# Patient Record
Sex: Female | Born: 1960 | Race: White | Hispanic: No | Marital: Married | Smoking: Never smoker
Health system: Southern US, Community
[De-identification: ages and names within clinical notes are randomized; demographics above are authoritative.]

## PROBLEM LIST (undated history)

## (undated) DIAGNOSIS — S0300XA Dislocation of jaw, unspecified side, initial encounter: Secondary | ICD-10-CM

## (undated) DIAGNOSIS — D219 Benign neoplasm of connective and other soft tissue, unspecified: Secondary | ICD-10-CM

## (undated) DIAGNOSIS — F419 Anxiety disorder, unspecified: Secondary | ICD-10-CM

## (undated) DIAGNOSIS — C801 Malignant (primary) neoplasm, unspecified: Secondary | ICD-10-CM

## (undated) DIAGNOSIS — R87619 Unspecified abnormal cytological findings in specimens from cervix uteri: Secondary | ICD-10-CM

## (undated) DIAGNOSIS — M8580 Other specified disorders of bone density and structure, unspecified site: Secondary | ICD-10-CM

## (undated) DIAGNOSIS — C439 Malignant melanoma of skin, unspecified: Secondary | ICD-10-CM

## (undated) DIAGNOSIS — B009 Herpesviral infection, unspecified: Secondary | ICD-10-CM

## (undated) DIAGNOSIS — I10 Essential (primary) hypertension: Secondary | ICD-10-CM

## (undated) HISTORY — DX: Unspecified abnormal cytological findings in specimens from cervix uteri: R87.619

## (undated) HISTORY — DX: Dislocation of jaw, unspecified side, initial encounter: S03.00XA

## (undated) HISTORY — DX: Other specified disorders of bone density and structure, unspecified site: M85.80

## (undated) HISTORY — DX: Essential (primary) hypertension: I10

## (undated) HISTORY — DX: Malignant (primary) neoplasm, unspecified: C80.1

## (undated) HISTORY — DX: Herpesviral infection, unspecified: B00.9

## (undated) HISTORY — DX: Malignant melanoma of skin, unspecified: C43.9

## (undated) HISTORY — DX: Benign neoplasm of connective and other soft tissue, unspecified: D21.9

## (undated) HISTORY — DX: Anxiety disorder, unspecified: F41.9

## (undated) HISTORY — PX: MELANOMA EXCISION: SHX5266

---

## 1988-12-12 DIAGNOSIS — R87619 Unspecified abnormal cytological findings in specimens from cervix uteri: Secondary | ICD-10-CM

## 1988-12-12 HISTORY — DX: Unspecified abnormal cytological findings in specimens from cervix uteri: R87.619

## 2005-07-21 ENCOUNTER — Other Ambulatory Visit: Admission: RE | Admit: 2005-07-21 | Discharge: 2005-07-21 | Payer: Self-pay | Admitting: Obstetrics and Gynecology

## 2005-07-27 ENCOUNTER — Encounter: Admission: RE | Admit: 2005-07-27 | Discharge: 2005-07-27 | Payer: Self-pay | Admitting: Obstetrics and Gynecology

## 2006-08-08 ENCOUNTER — Encounter: Admission: RE | Admit: 2006-08-08 | Discharge: 2006-08-08 | Payer: Self-pay | Admitting: Obstetrics and Gynecology

## 2007-11-29 ENCOUNTER — Ambulatory Visit (HOSPITAL_COMMUNITY): Admission: RE | Admit: 2007-11-29 | Discharge: 2007-11-29 | Payer: Self-pay | Admitting: Obstetrics and Gynecology

## 2008-11-10 ENCOUNTER — Ambulatory Visit: Payer: Self-pay | Admitting: Psychiatry

## 2011-01-02 ENCOUNTER — Encounter: Payer: Self-pay | Admitting: Obstetrics and Gynecology

## 2011-02-07 DIAGNOSIS — D239 Other benign neoplasm of skin, unspecified: Secondary | ICD-10-CM | POA: Insufficient documentation

## 2011-02-28 ENCOUNTER — Other Ambulatory Visit: Payer: Self-pay | Admitting: Obstetrics and Gynecology

## 2011-02-28 DIAGNOSIS — N644 Mastodynia: Secondary | ICD-10-CM

## 2011-03-03 ENCOUNTER — Ambulatory Visit
Admission: RE | Admit: 2011-03-03 | Discharge: 2011-03-03 | Disposition: A | Discharge status: Home / Self Care | Payer: Self-pay | Source: Ambulatory Visit | Attending: Obstetrics and Gynecology | Admitting: Obstetrics and Gynecology

## 2011-03-03 DIAGNOSIS — N644 Mastodynia: Secondary | ICD-10-CM

## 2011-03-08 ENCOUNTER — Ambulatory Visit: Payer: Private Health Insurance - Indemnity | Admitting: Hematology & Oncology

## 2011-03-16 ENCOUNTER — Ambulatory Visit (HOSPITAL_BASED_OUTPATIENT_CLINIC_OR_DEPARTMENT_OTHER): Payer: Private Health Insurance - Indemnity | Admitting: Hematology & Oncology

## 2011-03-16 DIAGNOSIS — F341 Dysthymic disorder: Secondary | ICD-10-CM

## 2011-03-16 DIAGNOSIS — C4360 Malignant melanoma of unspecified upper limb, including shoulder: Secondary | ICD-10-CM

## 2011-03-28 DIAGNOSIS — C439 Malignant melanoma of skin, unspecified: Secondary | ICD-10-CM | POA: Insufficient documentation

## 2011-06-07 DIAGNOSIS — E559 Vitamin D deficiency, unspecified: Secondary | ICD-10-CM | POA: Insufficient documentation

## 2011-10-20 DIAGNOSIS — J309 Allergic rhinitis, unspecified: Secondary | ICD-10-CM | POA: Insufficient documentation

## 2013-07-11 DIAGNOSIS — L659 Nonscarring hair loss, unspecified: Secondary | ICD-10-CM | POA: Insufficient documentation

## 2013-11-26 DIAGNOSIS — L908 Other atrophic disorders of skin: Secondary | ICD-10-CM

## 2013-11-26 DIAGNOSIS — L988 Other specified disorders of the skin and subcutaneous tissue: Secondary | ICD-10-CM | POA: Insufficient documentation

## 2014-01-08 DIAGNOSIS — G4700 Insomnia, unspecified: Secondary | ICD-10-CM | POA: Insufficient documentation

## 2014-01-08 DIAGNOSIS — F419 Anxiety disorder, unspecified: Secondary | ICD-10-CM | POA: Insufficient documentation

## 2016-06-27 DIAGNOSIS — D239 Other benign neoplasm of skin, unspecified: Secondary | ICD-10-CM | POA: Insufficient documentation

## 2016-12-26 ENCOUNTER — Other Ambulatory Visit: Payer: Self-pay | Admitting: Obstetrics and Gynecology

## 2016-12-26 DIAGNOSIS — R928 Other abnormal and inconclusive findings on diagnostic imaging of breast: Secondary | ICD-10-CM

## 2017-01-09 ENCOUNTER — Ambulatory Visit
Admission: RE | Admit: 2017-01-09 | Discharge: 2017-01-09 | Disposition: A | Discharge status: Home / Self Care | Payer: Commercial Managed Care - PPO | Source: Ambulatory Visit | Attending: Obstetrics and Gynecology | Admitting: Obstetrics and Gynecology

## 2017-01-09 ENCOUNTER — Other Ambulatory Visit: Payer: Self-pay | Admitting: Obstetrics and Gynecology

## 2017-01-09 DIAGNOSIS — Z170 Estrogen receptor positive status [ER+]: Secondary | ICD-10-CM | POA: Insufficient documentation

## 2017-01-09 DIAGNOSIS — R928 Other abnormal and inconclusive findings on diagnostic imaging of breast: Secondary | ICD-10-CM

## 2017-01-09 DIAGNOSIS — C50812 Malignant neoplasm of overlapping sites of left female breast: Secondary | ICD-10-CM | POA: Insufficient documentation

## 2017-01-09 DIAGNOSIS — N6489 Other specified disorders of breast: Secondary | ICD-10-CM

## 2017-01-11 ENCOUNTER — Other Ambulatory Visit: Payer: Commercial Managed Care - PPO

## 2017-03-01 DIAGNOSIS — Z884 Allergy status to anesthetic agent status: Secondary | ICD-10-CM | POA: Insufficient documentation

## 2017-03-02 DIAGNOSIS — C801 Malignant (primary) neoplasm, unspecified: Secondary | ICD-10-CM

## 2017-03-02 HISTORY — PX: BREAST SURGERY: SHX581

## 2017-03-02 HISTORY — DX: Malignant (primary) neoplasm, unspecified: C80.1

## 2017-03-27 ENCOUNTER — Encounter: Payer: Self-pay | Admitting: Obstetrics and Gynecology

## 2017-03-27 ENCOUNTER — Ambulatory Visit (INDEPENDENT_AMBULATORY_CARE_PROVIDER_SITE_OTHER): Payer: Commercial Managed Care - PPO | Admitting: Obstetrics and Gynecology

## 2017-03-27 VITALS — BP 130/76 | HR 84 | Resp 18 | Ht 61.5 in | Wt 110.2 lb

## 2017-03-27 DIAGNOSIS — N760 Acute vaginitis: Secondary | ICD-10-CM | POA: Diagnosis not present

## 2017-03-27 DIAGNOSIS — Z113 Encounter for screening for infections with a predominantly sexual mode of transmission: Secondary | ICD-10-CM | POA: Diagnosis not present

## 2017-03-27 DIAGNOSIS — Z8742 Personal history of other diseases of the female genital tract: Secondary | ICD-10-CM

## 2017-03-27 DIAGNOSIS — N952 Postmenopausal atrophic vaginitis: Secondary | ICD-10-CM | POA: Diagnosis not present

## 2017-03-27 NOTE — Patient Instructions (Signed)
Vaginitis Vaginitis is an inflammation of the vagina. It is most often caused by a change in the normal balance of the bacteria and yeast that live in the vagina. This change in balance causes an overgrowth of certain bacteria or yeast, which causes the inflammation. There are different types of vaginitis, but the most common types are:  Bacterial vaginosis.  Yeast infection (candidiasis).  Trichomoniasis vaginitis. This is a sexually transmitted infection (STI).  Viral vaginitis.  Atrophic vaginitis.  Allergic vaginitis. What are the causes? The cause depends on the type of vaginitis. Vaginitis can be caused by:  Bacteria (bacterial vaginosis).  Yeast (yeast infection).  A parasite (trichomoniasis vaginitis)  A virus (viral vaginitis).  Low hormone levels (atrophic vaginitis). Low hormone levels can occur during pregnancy, breastfeeding, or after menopause.  Irritants, such as bubble baths, scented tampons, and feminine sprays (allergic vaginitis). Other factors can change the normal balance of the yeast and bacteria that live in the vagina. These include:  Antibiotic medicines.  Poor hygiene.  Diaphragms, vaginal sponges, spermicides, birth control pills, and intrauterine devices (IUD).  Sexual intercourse.  Infection.  Uncontrolled diabetes.  A weakened immune system. What are the signs or symptoms? Symptoms can vary depending on the cause of the vaginitis. Common symptoms include:  Abnormal vaginal discharge.  The discharge is white, gray, or yellow with bacterial vaginosis.  The discharge is thick, white, and cheesy with a yeast infection.  The discharge is frothy and yellow or greenish with trichomoniasis.  A bad vaginal odor.  The odor is fishy with bacterial vaginosis.  Vaginal itching, pain, or swelling.  Painful intercourse.  Pain or burning when urinating. Sometimes there are no symptoms. How is this treated? Treatment will vary depending on  the type of infection.  Bacterial vaginosis and trichomoniasis are often treated with antibiotic creams or pills.  Yeast infections are often treated with antifungal medicines, such as vaginal creams or suppositories.  Viral vaginitis has no cure, but symptoms can be treated with medicines that relieve discomfort. Your sexual partner should be treated as well.  Atrophic vaginitis may be treated with an estrogen cream, pill, suppository, or vaginal ring. If vaginal dryness occurs, lubricants and moisturizing creams may help. You may be told to avoid scented soaps, sprays, or douches.  Allergic vaginitis treatment involves quitting the use of the product that is causing the problem. Vaginal creams can be used to treat the symptoms. Follow these instructions at home:  Take all medicines as directed by your caregiver.  Keep your genital area clean and dry. Avoid soap and only rinse the area with water.  Avoid douching. It can remove the healthy bacteria in the vagina.  Do not use tampons or have sexual intercourse until your vaginitis has been treated. Use sanitary pads while you have vaginitis.  Wipe from front to back. This avoids the spread of bacteria from the rectum to the vagina.  Let air reach your genital area. ? Wear cotton underwear to decrease moisture buildup.  Avoid wearing underwear while you sleep until your vaginitis is gone.  Avoid tight pants and underwear or nylons without a cotton panel.  Take off wet clothing (especially bathing suits) as soon as possible.  Use mild, non-scented products. Avoid using irritants, such as:  Scented feminine sprays.  Fabric softeners.  Scented detergents.  Scented tampons.  Scented soaps or bubble baths.  Practice safe sex and use condoms. Condoms may prevent the spread of trichomoniasis and viral vaginitis. Contact a health care  provider if:  You have abdominal pain.  You have symptoms that last for more than 2-3  days.  You have a fever and your symptoms suddenly get worse. This information is not intended to replace advice given to you by your health care provider. Make sure you discuss any questions you have with your health care provider. Document Released: 09/25/2007 Document Revised: 10/19/2016 Document Reviewed: 10/19/2016 Elsevier Interactive Patient Education  2017 [employer].

## 2017-03-27 NOTE — Progress Notes (Signed)
GYNECOLOGY  VISIT   HPI: 56 y.o.   Married  Caucasian  female   G0P0000 with Patient's last menstrual period was 12/12/2014 (approximate).   here for vaginal discharge without odor or itching. Sometimes it is very "sticky". She has seen a previous GYN and been treated for bacterial vaginosis with Flagyl on 02/21/17.  Discharge that varies in color.  Clear, white, yellow, and green. Symptoms improved with treatment above but not completely resolved. No itching or odor.   Contact with baby oil in external genital area.   Infidellity issues on part of husband.  Had full STD testing following this.   No recent abx other than Flagyl.  Recent diagnosis of left breast cancer.  Status post lumpectomy and Sentinal node at [facility]. Negative node.  Working on the follow up plan, and has an appointment tomorrow.  Has questions about ovarian cancer.   Hx HRT off and on since 2013.  Stopped this 01/04/17. Hx spotting.  First TV US 11/2015 and had thin lining.  Vaginal ultrasound February 21, 2017 - fibroids per patient. No EMB done.   Saw a Dietitian at [facility] and genetic testing was not recommended as she is not a high risk candidate.  GYNECOLOGIC HISTORY: Patient's last menstrual period was 12/12/2014 (approximate). Contraception:  Abstinence Menopausal hormone therapy:  n/a Last mammogram: see Epic--recently dx'd with Lt.Br.cancer and had Lt.lumpectomy at [facility]. Last pap smear:   11/2016 normal per patient        OB History    Gravida Para Term Preterm AB Living   0 0 0 0 0 0   SAB TAB Ectopic Multiple Live Births   0 0 0 0 0         There are no active problems to display for this patient.   Past Medical History:  Diagnosis Date  . Abnormal Pap smear of cervix 1990   Posible cryotherapy.  . Cancer ([place]) 03/02/2017   left breast cancer  . Fibroid   . 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 BREAST SURGERY  03/02/2017    left lumpectomy--[facility]  . MELANOMA EXCISION     left tricep    Current Outpatient Prescriptions  Medication Sig Dispense Refill  . nabumetone (RELAFEN) 500 MG tablet Take 1 tablet by mouth 2 (two) times daily.     No current facility-administered medications for this visit.      ALLERGIES: Lidocaine; Prilocaine; and Tetracaine  Family History  Problem Relation Age of Onset  . Breast cancer Mother     DCIS  . Hypertension Mother   . Diabetes Mother     borderline  . Autoimmune disease Mother     giant cell arteritis  . Thyroid disease Mother     hypothyrodism  . Hypertension Father   . Hypertension Sister   . Thyroid disease Sister     hypothyroidism  . Hypertension Brother     Social History   Social History  . Marital status: Married    Spouse name: N/A  . Number of children: N/A  . Years of education: N/A   Occupational History  . Not on file.   Social History Main Topics  . Smoking status: Never Smoker  . Smokeless tobacco: Never Used  . Alcohol use No  . Drug use: No  . Sexual activity: No   Other Topics Concern  . Not on file   Social History Narrative  .  No narrative on file    ROS:  Pertinent items are noted in HPI.  PHYSICAL EXAMINATION:    BP 130/76 (BP Location: Left Arm, Patient Position: Sitting, Cuff Size: Normal)   Pulse 84   Resp 18   Ht 5' 1.5" (1.562 m)   Wt 110 lb 3.2 oz (50 kg)   LMP 12/12/2014 (Approximate)   BMI 20.48 kg/m     General appearance: alert, cooperative and appears stated age Head: Normocephalic, without obvious abnormality, atraumatic Neck: no adenopathy, supple, symmetrical, trachea midline and thyroid normal to inspection and palpation Lungs: clear to auscultation bilaterally Heart: regular rate and rhythm Abdomen: soft, non-tender, no masses,  no organomegaly Extremities: extremities normal, atraumatic, no cyanosis or edema Skin: Skin color, texture, turgor normal. No rashes or lesions No abnormal  inguinal nodes palpated Neurologic: Grossly normal  Pelvic: External genitalia:  Hymen with areas of erythema.               Urethra:  normal appearing urethra with no masses, tenderness or lesions              Bartholins and Skenes: normal                 Vagina: normal appearing vagina with normal color and discharge, no lesions.  Tender speculum exam.  Erythema noted in vagina consistent with atrophy.              Cervix: no lesions                Bimanual Exam:  Uterus:  normal size, contour, position, consistency, mobility, non-tender              Adnexa: no mass, fullness, tenderness          Chaperone was present for exam.  ASSESSMENT  Vaginitis.  Evidence of vaginal atrophy today.  Recent postmenopausal bleeding with negative [place] per patient.  New dx left breast cancer.  Off HRT.  Mother with DCIS.   PLAN  Discussion of vaginitis, postmenopausal bleeding, and HRT. Will do Affirm and full STD testing today.  Ovarian cancer discussed.  Patient is not an ideal candidate for genetic testing.  Will get prior records to review her evaluation for postmenopausal bleeding.  Follow up for annual exam in 8 months.  An After Visit Summary was printed and given to the patient.  __30____ minutes face to face time of which over 50% was spent in counseling.

## 2017-03-28 LAB — WET PREP BY MOLECULAR PROBE
CANDIDA SPECIES: NOT DETECTED
Gardnerella vaginalis: DETECTED — AB
Trichomonas vaginosis: NOT DETECTED

## 2017-03-28 LAB — HEPATITIS C ANTIBODY: HCV AB: NEGATIVE

## 2017-03-28 LAB — GC/CHLAMYDIA PROBE AMP
CT Probe RNA: NOT DETECTED
GC Probe RNA: NOT DETECTED

## 2017-03-28 LAB — STD PANEL
HEP B S AG: NEGATIVE
HIV 1&2 Ab, 4th Generation: NONREACTIVE

## 2017-03-29 ENCOUNTER — Encounter: Payer: Self-pay | Admitting: Obstetrics and Gynecology

## 2017-03-30 ENCOUNTER — Other Ambulatory Visit: Payer: Self-pay | Admitting: *Deleted

## 2017-03-30 MED ORDER — METRONIDAZOLE 0.75 % VA GEL: 1.0000 | Freq: Every day | VAGINAL | 0 refills | Status: DC

## 2017-03-30 NOTE — Telephone Encounter (Signed)
See result note dated 03/30/17.

## 2017-04-06 ENCOUNTER — Telehealth: Payer: Self-pay | Admitting: Obstetrics and Gynecology

## 2017-04-06 ENCOUNTER — Encounter: Payer: Self-pay | Admitting: Obstetrics and Gynecology

## 2017-04-06 ENCOUNTER — Ambulatory Visit (INDEPENDENT_AMBULATORY_CARE_PROVIDER_SITE_OTHER): Payer: Commercial Managed Care - PPO | Admitting: Obstetrics and Gynecology

## 2017-04-06 VITALS — BP 142/88 | HR 100 | Temp 98.2°F | Ht 61.5 in | Wt 110.0 lb

## 2017-04-06 DIAGNOSIS — N760 Acute vaginitis: Secondary | ICD-10-CM | POA: Diagnosis not present

## 2017-04-06 MED ORDER — FLUCONAZOLE 150 MG PO TABS
150.0000 mg | ORAL_TABLET | Freq: Once | ORAL | 0 refills | Status: AC
Start: 2017-04-06 — End: 2017-04-06

## 2017-04-06 NOTE — Progress Notes (Signed)
GYNECOLOGY  VISIT   HPI: 56 y.o.   Married  Caucasian  female   G0P0000 with Patient's last menstrual period was 12/12/2014 (approximate).   here for white vaginal discharge and what appears to be flesh "rotting" away inside vagina. Patient is concerned about MRSA.  Notices something white and black. Use the Metrogel with the applicator for 5 nights. Denies pain.   No itching, burning, or having odor.   Has oral HSV.   Worried about MRSA.  Not sexually active currently.   Uses [employer] soap for her showers and it always burns.  Temp 98.2  GYNECOLOGIC HISTORY: Patient's last menstrual period was 12/12/2014 (approximate). Contraception: Abstinence  Menopausal hormone therapy:  n/a Last mammogram:  see Epic--recently dx'd with Lt.Br.cancer and had Lt.lumpectomy at [facility]. Last pap smear: 11/2016 normal per patient        OB History    Gravida Para Term Preterm AB Living   0 0 0 0 0 0   SAB TAB Ectopic Multiple Live Births   0 0 0 0 0         There are no active problems to display for this patient.   Past Medical History:  Diagnosis Date  . Abnormal Pap smear of cervix 1990   Posible cryotherapy.  . Cancer ([place]) 03/02/2017   left breast cancer  . Fibroid   . HSV-1 infection   . 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 BREAST SURGERY  03/02/2017   left lumpectomy--[facility]  . MELANOMA EXCISION     left tricep    Current Outpatient Prescriptions  Medication Sig Dispense Refill  . nabumetone (RELAFEN) 500 MG tablet Take 1 tablet by mouth 2 (two) times daily.    . metroNIDAZOLE (METROGEL) 0.75 % vaginal gel Place 1 Applicatorful vaginally at bedtime. For 5 nights. (Patient not taking: Reported on 04/06/2017) 70 g 0   No current facility-administered medications for this visit.      ALLERGIES: Prilocaine; Procaine; and Tetracaine  Family History  Problem Relation Age of Onset  . Breast cancer Mother      DCIS  . Hypertension Mother   . Diabetes Mother     borderline  . Autoimmune disease Mother     giant cell arteritis  . Thyroid disease Mother     hypothyrodism  . Hypertension Father   . Hypertension Sister   . Thyroid disease Sister     hypothyroidism  . Hypertension Brother     Social History   Social History  . Marital status: Married    Spouse name: N/A  . Number of children: N/A  . Years of education: N/A   Occupational History  . Not on file.   Social History Main Topics  . Smoking status: Never Smoker  . Smokeless tobacco: Never Used  . Alcohol use No  . Drug use: No  . Sexual activity: No   Other Topics Concern  . Not on file   Social History Narrative  . No narrative on file    ROS:  Pertinent items are noted in HPI.  PHYSICAL EXAMINATION:    BP (!) 142/88 (BP Location: Left Arm, Patient Position: Sitting, Cuff Size: Normal)   Pulse 100   Temp 98.2 F (36.8 C) (Oral)   Ht 5' 1.5" (1.562 m)   Wt 110 lb (49.9 kg)   LMP 12/12/2014 (Approximate)   BMI 20.45 kg/m  General appearance: alert, cooperative and appears stated age    Pelvic: External genitalia:  Atrophy of the bilateral labia minora.  Very mild urethral prolapase noted.              Urethra:  normal appearing urethra with no masses, tenderness or lesions              Bartholins and Skenes: normal                 Vagina: normal appearing vagina with normal color and discharge, no lesions.  Clumpy clear and white discharge.              Cervix: no lesions                Bimanual Exam:  Uterus:  normal size, contour, position, consistency, mobility, non-tender              Adnexa: no mass, fullness, tenderness                 Chaperone was present for exam.  ASSESSMENT  Vulvovaginitis.  No evidence of MRSA. I think that this is multifactorial including atrophy, dermatitis from product irritants, and Candida.  Hx breast cancer.   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  We talked about forms of vaginitis today.  No Affirm done due to the Metrogel present. Will treat with Diflucan today.  Use Dove soap.  Change laundry detergent to hypoallergenic.  For atrophy can use water based products, cooking oil, vit E suppositories, or Josph Macho.  I would try the more simple products prior to doing Josph Macho.  Patient appreciative of her care today.   An After Visit Summary was printed and given to the patient.  __25____ minutes face to face time of which over 50% was spent in counseling.

## 2017-04-06 NOTE — Telephone Encounter (Signed)
Patient saw Dr Quincy Simmonds last week for a discharge.  States she woke up this morning and looked at her vagina with a mirror and it looks like the skin is "rotting off".  Patient has no pain, itching, burning, or bleeding just "looks like her flesh is disappearing".

## 2017-04-06 NOTE — Telephone Encounter (Signed)
Spoke with patient. Patient states she was treated with metrogel for BV and was aware could possibly develop yeast infection from metrogel. Patient states she was looking at vagina with mirror yesterday and noticed a little white discharge. Patient states she looked again this morning and noticed white discharge and what appears to be flesh on inside of vagina rotting away. Patient denies any other symptoms, states she was seen at [facility] on Tues and was advised temp was 1 degree above normal, denies any other symptoms. Recommended OV for further evaluation with Dr. Quincy Simmonds, patient scheduled for today at 10am. Patient is agreeable.  Routing to provider for final review. Patient is agreeable to disposition. Will close encounter.

## 2017-04-07 ENCOUNTER — Encounter: Payer: Self-pay | Admitting: Obstetrics and Gynecology

## 2017-04-12 ENCOUNTER — Encounter: Payer: Self-pay | Admitting: Obstetrics and Gynecology

## 2017-04-19 ENCOUNTER — Encounter: Payer: Self-pay | Admitting: Obstetrics and Gynecology

## 2017-04-27 ENCOUNTER — Encounter: Payer: Self-pay | Admitting: Obstetrics and Gynecology

## 2017-07-20 DIAGNOSIS — E7800 Pure hypercholesterolemia, unspecified: Secondary | ICD-10-CM | POA: Insufficient documentation

## 2017-09-08 ENCOUNTER — Telehealth: Payer: Self-pay | Admitting: Obstetrics and Gynecology

## 2017-09-08 NOTE — Telephone Encounter (Signed)
Patient having vaginal pain and incontinence.

## 2017-09-08 NOTE — Telephone Encounter (Signed)
Spoke with patient, request appointment with Dr. Quincy Simmonds for  "weird vaginal pain" and incontinence that has been ongoing for the last 3-4 months.  Reports pain in vagina when showering and wiping, describes as "stinging". Feels like this is r/t "structural changes". Denies pain with urination.  Reports urinary incontinence, "can't hold it" and has to now get up during the night to void.   Denies fever, lower back pain, N/V, frequency, discharge, odor, bleeding.   Has been taking Letrozole since August, symptoms started before starting medication.   Patient states she has multiple appointments in the next 2 weeks, request to schedule 10/15. Scheduled for OV on 10/15 at 1pm with Dr. Quincy Simmonds. Advised patient should symptoms worsen or new symptoms develop, return call to office for earlier appointment. Will review with Dr. Quincy Simmonds and return call with any additional recommendations.   Routing to provider for final review. Patient is agreeable to disposition. Will close encounter.

## 2017-09-25 ENCOUNTER — Encounter: Payer: Self-pay | Admitting: Obstetrics and Gynecology

## 2017-09-25 ENCOUNTER — Ambulatory Visit (INDEPENDENT_AMBULATORY_CARE_PROVIDER_SITE_OTHER): Payer: Commercial Managed Care - PPO | Admitting: Obstetrics and Gynecology

## 2017-09-25 VITALS — BP 118/70 | HR 84 | Resp 16 | Wt 107.0 lb

## 2017-09-25 DIAGNOSIS — R32 Unspecified urinary incontinence: Secondary | ICD-10-CM | POA: Diagnosis not present

## 2017-09-25 DIAGNOSIS — N905 Atrophy of vulva: Secondary | ICD-10-CM

## 2017-09-25 LAB — POCT URINALYSIS DIPSTICK
BILIRUBIN UA: NEGATIVE
Blood, UA: NEGATIVE
GLUCOSE UA: NEGATIVE
KETONES UA: NEGATIVE
Leukocytes, UA: NEGATIVE
Nitrite, UA: NEGATIVE
Protein, UA: NEGATIVE
Urobilinogen, UA: 0.2 E.U./dL
pH, UA: 5 (ref 5.0–8.0)

## 2017-09-25 NOTE — Progress Notes (Signed)
GYNECOLOGY  VISIT   HPI: 56 y.o.   Married  Caucasian  female   G0P0000 with Patient's last menstrual period was 12/12/2014 (approximate).   here for  Urinary incontinence.  Struggling with atrophy. Using a Vagisil wash, which does not burn.  Using Replens for moisture.   Has stinging with bathing and sometimes wiping.  No spontaneous pain.  Aloe wipes help.   Leaks with sneezing.  No leak with laugh, cough, or exercise.  Leaks before she can get to the bathroom on time.  DF - in the am every 30 minutes after coffee, then every 3 hours in the afternoon.  NF - 1 - 2 times. Does not think her bladder has dropped but has some concern as she was told at another GYN office that she may need surgery some day.  Denies constipation.  Calcium causes constipation for her.  No fecal incontinence.   Just started tamoxifen this week.   New dx of osteopenia.   ROS otherwise negative.   Urine Dip - Negative   GYNECOLOGIC HISTORY: Patient's last menstrual period was 12/12/2014 (approximate). Contraception:  Abstinence/postmenopausal Menopausal hormone therapy:  none Last mammogram:  See EPIC for details -- scheduled 12/20/17 at [place] pap smear:   11/2016 normal per patient        OB History    Gravida Para Term Preterm AB Living   0 0 0 0 0 0   SAB TAB Ectopic Multiple Live Births   0 0 0 0 0         There are no active problems to display for this patient.   Past Medical History:  Diagnosis Date  . Abnormal Pap smear of cervix 1990   Posible cryotherapy.  . Cancer ([place]) 03/02/2017   left breast cancer  . Fibroids   . HSV-1 infection   . 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 BREAST SURGERY  03/02/2017   left lumpectomy--[facility]  . MELANOMA EXCISION     left tricep    Current Outpatient Prescriptions  Medication Sig Dispense Refill  . acyclovir (ZOVIRAX) 800 MG tablet as needed.    . Calcium Carbonate-Vitamin D  600-400 MG-UNIT tablet Take 1 tablet by mouth daily.  11  . diclofenac (CATAFLAM) 50 MG tablet as needed.    . Diclofenac Sodium (PENNSAID) 2 % SOLN as needed.    . etodolac (LODINE) 400 MG tablet as needed.    . montelukast (SINGULAIR) 10 MG tablet Take by mouth daily.    . rosuvastatin (CRESTOR) 10 MG tablet Take by mouth daily.    . tamoxifen (NOLVADEX) 20 MG tablet Take by mouth daily.    Marland Kitchen tiZANidine (ZANAFLEX) 4 MG tablet as needed.     No current facility-administered medications for this visit.      ALLERGIES: Benzocaine; Epinephrine; Paba derivatives; Lanolin; Nickel; Prilocaine; Procaine; and Tetracaine  Family History  Problem Relation Age of Onset  . Breast cancer Mother        DCIS  . Hypertension Mother   . Diabetes Mother        borderline  . Autoimmune disease Mother        giant cell arteritis  . Thyroid disease Mother        hypothyrodism  . Hypertension Father   . Hypertension Sister   . Thyroid disease Sister        hypothyroidism  . Hypertension Brother  Social History   Social History  . Marital status: Married    Spouse name: N/A  . Number of children: N/A  . Years of education: N/A   Occupational History  . Not on file.   Social History Main Topics  . Smoking status: Never Smoker  . Smokeless tobacco: Never Used  . Alcohol use No  . Drug use: No  . Sexual activity: No   Other Topics Concern  . Not on file   Social History Narrative  . No narrative on file    ROS:  Pertinent items are noted in HPI.  PHYSICAL EXAMINATION:    BP 118/70 (BP Location: Right Arm, Patient Position: Sitting, Cuff Size: Normal)   Pulse 84   Resp 16   Wt 107 lb (48.5 kg)   LMP 12/12/2014 (Approximate)   BMI 19.89 kg/m     General appearance: alert, cooperative and appears stated age    Pelvic: External genitalia:  no lesions              Urethra:  normal appearing urethra with no masses, tenderness or lesions              Bartholins and Skenes:  normal                 Vagina: normal appearing vagina with normal color and discharge, no lesions.  Atrophy noted.               Cervix: no lesions                Bimanual Exam:  Uterus:  normal size, contour, position, consistency, mobility, non-tender              Adnexa: no mass, fullness, tenderness        Chaperone was present for exam.  ASSESSMENT  Urinary incontinence and frequency.  Vaginal atrophy.  No evidence of prolapse. Hx breast cancer.  Started tamoxifen.  Osteopenia.   PLAN  Discussed urinary incontinence and frequency.  Kegel's.  Avoid bladder irritants.  I discussed anticholinergic/antimuscarinic medications.  Will not do this for now.  We reviewed vit E vaginal suppositories and cooking oils for vaginal hydration.  I recommended against the Josph Macho touch after sharing published information from [employer]. Return for annual exam and routing labs   An After Visit Summary was printed and given to the patient.  __15____ minutes face to face time of which over 50% was spent in counseling.

## 2017-09-25 NOTE — Patient Instructions (Addendum)
Go to [employer] to buy vaginal vitamin E suppositories.  Also try cooking oils like coconut oil.  Kegel Exercises Kegel exercises help strengthen the muscles that support the rectum, vagina, small intestine, bladder, and uterus. Doing Kegel exercises can help:  Improve bladder and bowel control.  Improve sexual response.  Reduce problems and discomfort during pregnancy.  Kegel exercises involve squeezing your pelvic floor muscles, which are the same muscles you squeeze when you try to stop the flow of urine. The exercises can be done while sitting, standing, or lying down, but it is best to vary your position. Phase 1 exercises 1. Squeeze your pelvic floor muscles tight. You should feel a tight lift in your rectal area. If you are a female, you should also feel a tightness in your vaginal area. Keep your stomach, buttocks, and legs relaxed. 2. Hold the muscles tight for up to 10 seconds. 3. Relax your muscles. Repeat this exercise 50 times a day or as many times as told by your health care provider. Continue to do this exercise for at least 4-6 weeks or for as long as told by your health care provider. This information is not intended to replace advice given to you by your health care provider. Make sure you discuss any questions you have with your health care provider. Document Released: 11/14/2012 Document Revised: 07/23/2016 Document Reviewed: 10/18/2015 Elsevier Interactive Patient Education  [employer].

## 2017-11-07 ENCOUNTER — Telehealth: Payer: Self-pay | Admitting: Obstetrics and Gynecology

## 2017-11-07 NOTE — Telephone Encounter (Signed)
Spoke with patient. Patient states that she has had a vaginal discharge since 09/26/2017 that is not getting better. Patient has an annual exam on 12/07/2017 and is asking if she can have testing done at that appointment or move her aex up. Aex moved to 11/17/2017 at 1:30 pm with Dr.Silva. Patient is agreeable to date and time.  Routing to provider for final review. Patient agreeable to disposition. Will close encounter.

## 2017-11-07 NOTE — Telephone Encounter (Signed)
patint thinks she has a bacterial infection

## 2017-11-17 ENCOUNTER — Other Ambulatory Visit: Payer: Self-pay

## 2017-11-17 ENCOUNTER — Encounter: Payer: Self-pay | Admitting: Obstetrics and Gynecology

## 2017-11-17 ENCOUNTER — Other Ambulatory Visit (HOSPITAL_COMMUNITY)
Admission: RE | Admit: 2017-11-17 | Discharge: 2017-11-17 | Disposition: A | Discharge status: Home / Self Care | Payer: Commercial Managed Care - PPO | Source: Ambulatory Visit | Attending: Obstetrics and Gynecology | Admitting: Obstetrics and Gynecology

## 2017-11-17 ENCOUNTER — Ambulatory Visit (INDEPENDENT_AMBULATORY_CARE_PROVIDER_SITE_OTHER): Payer: Commercial Managed Care - PPO | Admitting: Obstetrics and Gynecology

## 2017-11-17 VITALS — BP 142/76 | HR 74 | Resp 14 | Ht 61.25 in | Wt 109.5 lb

## 2017-11-17 DIAGNOSIS — N760 Acute vaginitis: Secondary | ICD-10-CM | POA: Diagnosis not present

## 2017-11-17 DIAGNOSIS — Z01419 Encounter for gynecological examination (general) (routine) without abnormal findings: Secondary | ICD-10-CM

## 2017-11-17 NOTE — Progress Notes (Signed)
56 y.o. G15P0000 Married Caucasian female here for annual exam.    Wants yearly pap.  Reports some white vaginal discharge.  Some slight odor.   On Tamoxifen.   New dx osteopenia.  [facility].   Asking about prior reports of pap and pelvic US from Dr. Malachi Carl office.  PCP:   none  Patient's last menstrual period was 12/12/2014 (approximate).           Sexually active: No.  The current method of family planning is post menopausal status.    Exercising: Yes.    weights, intervals  Smoker:  no  Health Maintenance: Pap:  11/12/14 negative, 2016 normal and negative HR HPV at [facility].   Patient confident she had a pap in 2017 with Dr. Philis Pique.  No documentation of this to date. History of abnormal Pap:  yes MMG: at [facility]- scheduled for 12/2017  Colonoscopy:  Summer 2018 with Dr. Collene Mares.  Adenoma.  Next colonoscopy in 3 years. BMD:   07/26/17  Result  Osteopenia of bilateral hips. TDaP:  Unsure  Gardasil:   n/a HIV: 03/27/17 negative  Hep C: 03/27/17 negative  Screening Labs: discuss with provider Hb today: same, Urine today: has sample    reports that  has never smoked. she has never used smokeless tobacco. She reports that she does not drink alcohol or use drugs.  Past Medical History:  Diagnosis Date  . Abnormal Pap smear of cervix 1990   Posible cryotherapy.  . Cancer ([place]) 03/02/2017   left breast cancer  . Fibroids   . HSV-1 infection   . 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 BREAST SURGERY  03/02/2017   left lumpectomy--[facility]  . MELANOMA EXCISION     left tricep    Current Outpatient Medications  Medication Sig Dispense Refill  . acyclovir (ZOVIRAX) 800 MG tablet as needed.    . Calcium Carbonate-Vitamin D 600-400 MG-UNIT tablet Take 1 tablet by mouth daily.  11  . diclofenac (VOLTAREN) 75 MG EC tablet Take by mouth.    . Diclofenac Sodium (PENNSAID) 2 % SOLN as needed.    . diphenhydrAMINE (BENADRYL) 25 mg capsule  Take by mouth.    . methocarbamol (ROBAXIN) 500 MG tablet     . rosuvastatin (CRESTOR) 10 MG tablet Take by mouth daily.    . tamoxifen (NOLVADEX) 20 MG tablet Take by mouth daily.    Marland Kitchen tiZANidine (ZANAFLEX) 4 MG tablet as needed.     No current facility-administered medications for this visit.     Family History  Problem Relation Age of Onset  . Breast cancer Mother        DCIS  . Hypertension Mother   . Diabetes Mother        borderline  . Autoimmune disease Mother        giant cell arteritis  . Thyroid disease Mother        hypothyrodism  . Hypertension Father   . Hypertension Sister   . Thyroid disease Sister        hypothyroidism  . Hypertension Brother     ROS:  Pertinent items are noted in HPI.  Otherwise, a comprehensive ROS was negative.  Exam:   BP (!) 142/76 (BP Location: Right Arm, Patient Position: Sitting, Cuff Size: Normal)   Pulse 74   Resp 14   Ht 5' 1.25" (1.556 m)   Wt 109 lb 8 oz (49.7 kg)  LMP 12/12/2014 (Approximate)   BMI 20.52 kg/m     General appearance: alert, cooperative and appears stated age Head: Normocephalic, without obvious abnormality, atraumatic Neck: no adenopathy, supple, symmetrical, trachea midline and thyroid normal to inspection and palpation Lungs: clear to auscultation bilaterally Breasts: bilateral generalized tenderness and scars, Normal appearance, no masses or tenderness, No nipple retraction or dimpling, No nipple discharge or bleeding, No axillary or supraclavicular adenopathy Heart: regular rate and rhythm Abdomen: soft, non-tender; no masses, no organomegaly Extremities: extremities normal, atraumatic, no cyanosis or edema Skin: Skin color, texture, turgor normal. No rashes or lesions Lymph nodes: Cervical, supraclavicular, and axillary nodes normal. No abnormal inguinal nodes palpated Neurologic: Grossly normal  Pelvic: External genitalia:  no lesions              Urethra:  normal appearing urethra with no  masses, tenderness or lesions              Bartholins and Skenes: normal                 Vagina: normal appearing vagina with normal color and discharge, no lesions              Cervix: no lesions              Pap taken: Yes.   Bimanual Exam:  Uterus:  normal size, contour, position, consistency, mobility, non-tender              Adnexa: no mass, fullness, tenderness              Rectal exam: Yes.  .  Confirms.              Anus:  normal sphincter tone, no lesions  Chaperone was present for exam.  Assessment:   Well woman visit with normal exam. Osteopenia.  Followed at [facility].  Left breast cancer.  Status post lumpectomy and XRT. On Tamoxifen.  Vaginitis.  Hx HSV I.  Hx melanoma.  Plan: Mammogram screening discussed. Recommended self breast awareness. Pap and HR HPV as above.  Vaginitis panel added to pap. Guidelines for Calcium, Vitamin D, regular exercise program including cardiovascular and weight bearing exercise. Will get a copy of pap from 2017, pelvic ultrasound report from 2018, and colonoscopy with Dr. Collene Mares. We talked about risk of adenocarcinoma of the endometrium while on Tamoxifen.  She will call for any bleeding, spotting, pelvic pain, or any concern. Sees dermatology regularly.  Follow up annually and prn.    After visit summary provided.

## 2017-11-17 NOTE — Patient Instructions (Signed)

## 2017-11-22 ENCOUNTER — Telehealth: Payer: Self-pay | Admitting: *Deleted

## 2017-11-22 ENCOUNTER — Telehealth: Payer: Self-pay

## 2017-11-22 ENCOUNTER — Encounter: Payer: Self-pay | Admitting: Obstetrics and Gynecology

## 2017-11-22 NOTE — Telephone Encounter (Signed)
Opened in error, will close encounter

## 2017-11-22 NOTE — Telephone Encounter (Signed)
MyChart Message  Visit Follow-Up Question  Message [id number]  From Alasha, Mcguinness To Nunzio Cobbs, MD Sent 11/22/2017 9:33 AM Hi!   I'm just wondering about the results of the BV test?   Thx!   Jaliana~   Responsible Party   Pool - [facility] Clinical Pool No one has taken responsibility for this message. No actions have been taken on this message.   Called patient to discuss, left message on voicemail to call me(results are pending).

## 2017-11-23 LAB — CYTOLOGY - PAP
Adequacy: ABSENT
Bacterial vaginitis: POSITIVE — AB
CANDIDA VAGINITIS: NEGATIVE
Diagnosis: NEGATIVE
HPV (WINDOPATH): NOT DETECTED
TRICH (WINDOWPATH): NEGATIVE

## 2017-11-23 NOTE — Telephone Encounter (Signed)
Called patient to discuss lab results, lmovm to call me back. (results not in Epic yet, so called [facility] cytology and they faxed me a copy of pap and culture results).

## 2017-11-24 MED ORDER — METRONIDAZOLE 500 MG PO TABS: 500.0000 mg | ORAL_TABLET | Freq: Two times a day (BID) | ORAL | 0 refills | Status: DC

## 2017-11-24 NOTE — Telephone Encounter (Signed)
Patient returning [name]'s call.

## 2017-11-24 NOTE — Telephone Encounter (Signed)
Spoke with patient and notified of positive bacterial vaginitis per Dr.Miller. Advised will escribed Metrogel or Metronidazole--her preference. Patient prefers oral medication. Sent Rx for G.Flagyl 500mg  #14, 1 po bid #14, NR to [facility] on [address]. Advised patient to call if symptoms don't resolve. Also informed her that her pap smear was negative and HR HPV was negative. She has AEX 11-19-18.

## 2017-12-01 ENCOUNTER — Telehealth: Payer: Self-pay

## 2017-12-01 NOTE — Telephone Encounter (Signed)
Opened in error

## 2017-12-07 ENCOUNTER — Ambulatory Visit: Payer: Commercial Managed Care - PPO | Admitting: Obstetrics and Gynecology

## 2017-12-08 ENCOUNTER — Telehealth: Payer: Self-pay

## 2017-12-08 MED ORDER — METRONIDAZOLE 0.75 % VA GEL: 1.0000 | Freq: Every day | VAGINAL | 0 refills | Status: DC

## 2017-12-08 NOTE — Telephone Encounter (Signed)
-----   Message from Megan Salon, MD sent at 11/24/2017  5:45 PM EST ----- Regarding: outside record review Tonya Yang, Could you call this pt?  Her outside records came (a stack about 2 inches thick).  There is no pap result from 2017 and the notes do not indicate a pap smear was done.  There was a vaginal swab done from 2017 for yeast and BV that was negative.    Also, her ultrasound showed thin endometrium, four fibroids with the largest measuring 4cm and normal ovaries.    I think Dr. Quincy Simmonds was getting this to review as pt had some questions.  I'm glad to try and answer them if I can.  Thanks.  Vinnie Level

## 2017-12-08 NOTE — Telephone Encounter (Signed)
Ok to treat with metrogel 0.75% nightly x 5 nights.  If still symptomatic, would recommend repeat testing for BV.

## 2017-12-08 NOTE — Telephone Encounter (Signed)
Spoke with patient and notified would send Rx for Metrogel to use per vagina nightly x 5 nights per Dr.Miller. If symptoms persist, she would need to make a return office visit. Patient voices understanding.

## 2017-12-08 NOTE — Telephone Encounter (Signed)
Spoke with patient and advised we had received outside records. Advised there was no pap results from 2017 and the notes do not indicate a pap smear was done. Also advised PUS revealed thin endometrium, four fibroids and normal ovaries. Offered follow up with Dr.Miller if she had any questions since Dr.Silva out on leave of absence. Patient states Dr.Silva just wanted records to review and patient doesn't have any questions.  Patient was treated 2 weeks ago for BV with oral Flagyl 500mg  bid x7days without complete resolve of symptoms. She states when she has had this before, she thinks she had to take the Metrogel. Still with some vaginal discharge and odor. Will discuss with Dr.Miller and call her back. She wanted to thank Dr.Miller for all her help. Routed to [place]

## 2017-12-15 ENCOUNTER — Ambulatory Visit: Payer: Commercial Managed Care - PPO | Admitting: Obstetrics and Gynecology

## 2018-02-05 ENCOUNTER — Telehealth: Payer: Self-pay | Admitting: Obstetrics and Gynecology

## 2018-02-05 ENCOUNTER — Encounter: Payer: Self-pay | Admitting: Obstetrics and Gynecology

## 2018-02-05 NOTE — Telephone Encounter (Signed)
-----   Message from [place], Generic sent at 02/05/2018 11:52 AM EST -----    Hi!    It seems the BV didn't completely resolve after the metrogel tx; although, I did miss one night's dose. I was hoping it may clear on its on, but that doesn't seem to be working.     I'm not in any discomfort so no rush needed to respond.     Thanks,    Hinton Dyer

## 2018-02-05 NOTE — Telephone Encounter (Signed)
Spoke with patient, reports continued vaginal d/c, requesting RX for BV.   Reports yellow, vaginal d/c, no odor has not resolved. Treated for BV with Flagyl po on 12/14 and metrogel on 12/28.   Denies any other GYN symptoms, pain or bleeding.   Recommended OV for further evaluation. OV scheduled for 02/12/18 at 3:30pm with Dr. Quincy Simmonds. Patient verbalizes understanding and is agreeable.   Routing to provider for final review. Patient is agreeable to disposition. Will close encounter.

## 2018-02-12 ENCOUNTER — Encounter: Payer: Self-pay | Admitting: Obstetrics and Gynecology

## 2018-02-12 ENCOUNTER — Ambulatory Visit: Payer: Commercial Managed Care - PPO | Admitting: Obstetrics and Gynecology

## 2018-02-12 ENCOUNTER — Other Ambulatory Visit: Payer: Self-pay

## 2018-02-12 VITALS — BP 114/68 | HR 72 | Resp 16 | Wt 113.0 lb

## 2018-02-12 DIAGNOSIS — N760 Acute vaginitis: Secondary | ICD-10-CM | POA: Diagnosis not present

## 2018-02-12 NOTE — Patient Instructions (Signed)
Bacterial Vaginosis  Bacterial vaginosis is a vaginal infection that occurs when the normal balance of bacteria in the vagina is disrupted. It results from an overgrowth of certain bacteria. This is the most common vaginal infection among women ages 15-44.  Because bacterial vaginosis increases your risk for STIs (sexually transmitted infections), getting treated can help reduce your risk for chlamydia, gonorrhea, herpes, and HIV (human immunodeficiency virus). Treatment is also important for preventing complications in pregnant women, because this condition can cause an early (premature) delivery.  What are the causes?  This condition is caused by an increase in harmful bacteria that are normally present in small amounts in the vagina. However, the reason that the condition develops is not fully understood.  What increases the risk?  The following factors may make you more likely to develop this condition:  · Having a new sexual partner or multiple sexual partners.  · Having unprotected sex.  · Douching.  · Having an intrauterine device (IUD).  · Smoking.  · Drug and alcohol abuse.  · Taking certain antibiotic medicines.  · Being pregnant.    You cannot get bacterial vaginosis from toilet seats, bedding, swimming pools, or contact with objects around you.  What are the signs or symptoms?  Symptoms of this condition include:  · Grey or white vaginal discharge. The discharge can also be watery or foamy.  · A fish-like odor with discharge, especially after sexual intercourse or during menstruation.  · Itching in and around the vagina.  · Burning or pain with urination.    Some women with bacterial vaginosis have no signs or symptoms.  How is this diagnosed?  This condition is diagnosed based on:  · Your medical history.  · A physical exam of the vagina.  · Testing a sample of vaginal fluid under a microscope to look for a large amount of bad bacteria or abnormal cells. Your health care provider may use a cotton swab  or a small wooden spatula to collect the sample.    How is this treated?  This condition is treated with antibiotics. These may be given as a pill, a vaginal cream, or a medicine that is put into the vagina (suppository). If the condition comes back after treatment, a second round of antibiotics may be needed.  Follow these instructions at home:  Medicines  · Take over-the-counter and prescription medicines only as told by your health care provider.  · Take or use your antibiotic as told by your health care provider. Do not stop taking or using the antibiotic even if you start to feel better.  General instructions  · If you have a female sexual partner, tell her that you have a vaginal infection. She should see her health care provider and be treated if she has symptoms. If you have a female sexual partner, he does not need treatment.  · During treatment:  ? Avoid sexual activity until you finish treatment.  ? Do not douche.  ? Avoid alcohol as directed by your health care provider.  ? Avoid breastfeeding as directed by your health care provider.  · Drink enough water and fluids to keep your urine clear or pale yellow.  · Keep the area around your vagina and rectum clean.  ? Wash the area daily with warm water.  ? Wipe yourself from front to back after using the toilet.  · Keep all follow-up visits as told by your health care provider. This is important.  How is this prevented?  ·   Do not douche.  · Wash the outside of your vagina with warm water only.  · Use protection when having sex. This includes latex condoms and dental dams.  · Limit how many sexual partners you have. To help prevent bacterial vaginosis, it is best to have sex with just one partner (monogamous).  · Make sure you and your sexual partner are tested for STIs.  · Wear cotton or cotton-lined underwear.  · Avoid wearing tight pants and pantyhose, especially during summer.  · Limit the amount of alcohol that you drink.  · Do not use any products that  contain nicotine or tobacco, such as cigarettes and e-cigarettes. If you need help quitting, ask your health care provider.  · Do not use illegal drugs.  Where to find more information:  · Centers for Disease Control and Prevention: www.cdc.gov/std  · American Sexual Health Association (ASHA): www.ashastd.org  · U.S. Department of Health and Human Services, Office on Women's Health: www.womenshealth.gov/ or https://www.womenshealth.gov/a-z-topics/bacterial-vaginosis  Contact a health care provider if:  · Your symptoms do not improve, even after treatment.  · You have more discharge or pain when urinating.  · You have a fever.  · You have pain in your abdomen.  · You have pain during sex.  · You have vaginal bleeding between periods.  Summary  · Bacterial vaginosis is a vaginal infection that occurs when the normal balance of bacteria in the vagina is disrupted.  · Because bacterial vaginosis increases your risk for STIs (sexually transmitted infections), getting treated can help reduce your risk for chlamydia, gonorrhea, herpes, and HIV (human immunodeficiency virus). Treatment is also important for preventing complications in pregnant women, because the condition can cause an early (premature) delivery.  · This condition is treated with antibiotic medicines. These may be given as a pill, a vaginal cream, or a medicine that is put into the vagina (suppository).  This information is not intended to replace advice given to you by your health care provider. Make sure you discuss any questions you have with your health care provider.  Document Released: 11/28/2005 Document Revised: 04/03/2017 Document Reviewed: 08/13/2016  Elsevier Interactive Patient Education © 2018 Elsevier Inc.    Vaginitis  Vaginitis is a condition in which the vaginal tissue swells and becomes red (inflamed). This condition is most often caused by a change in the normal balance of bacteria and yeast that live in the vagina. This change causes an  overgrowth of certain bacteria or yeast, which causes the inflammation. There are different types of vaginitis, but the most common types are:  · Bacterial vaginosis.  · Yeast infection (candidiasis).  · Trichomoniasis vaginitis. This is a sexually transmitted disease (STD).  · Viral vaginitis.  · Atrophic vaginitis.  · Allergic vaginitis.    What are the causes?  The cause of this condition depends on the type of vaginitis. It can be caused by:  · Bacteria (bacterial vaginosis).  · Yeast, which is a fungus (yeast infection).  · A parasite (trichomoniasis vaginitis).  · A virus (viral vaginitis).  · Low hormone levels (atrophic vaginitis). Low hormone levels can occur during pregnancy, breastfeeding, or after menopause.  · Irritants, such as bubble baths, scented tampons, and feminine sprays (allergic vaginitis).    Other factors can change the normal balance of the yeast and bacteria that live in the vagina. These include:  · Antibiotic medicines.  · Poor hygiene.  · Diaphragms, vaginal sponges, spermicides, birth control pills, and intrauterine devices (IUD).  ·   Sex.  · Infection.  · Uncontrolled diabetes.  · A weakened defense (immune) system.    What increases the risk?  This condition is more likely to develop in women who:  · Smoke.  · Use vaginal douches, scented tampons, or scented sanitary pads.  · Wear tight-fitting pants.  · Wear thong underwear.  · Use oral birth control pills or an IUD.  · Have sex without a condom.  · Have multiple sex partners.  · Have an STD.  · Frequently use the spermicide nonoxynol-9.  · Eat lots of foods high in sugar.  · Have uncontrolled diabetes.  · Have low estrogen levels.  · Have a weakened immune system from an immune disorder or medical treatment.  · Are pregnant or breastfeeding.    What are the signs or symptoms?  Symptoms vary depending on the cause of the vaginitis. Common symptoms include:  · Abnormal vaginal discharge.  ? The discharge is white, gray, or yellow with  bacterial vaginosis.  ? The discharge is thick, white, and cheesy with a yeast infection.  ? The discharge is frothy and yellow or greenish with trichomoniasis.  · A bad vaginal smell. The smell is fishy with bacterial vaginosis.  · Vaginal itching, pain, or swelling.  · Sex that is painful.  · Pain or burning when urinating.    Sometimes there are no symptoms.  How is this diagnosed?  This condition is diagnosed based on your symptoms and medical history. A physical exam, including a pelvic exam, will also be done. You may also have other tests, including:  · Tests to determine the pH level (acidity or alkalinity) of your vagina.  · A whiff test, to assess the odor that results when a sample of your vaginal discharge is mixed with a potassium hydroxide solution.  · Tests of vaginal fluid. A sample will be examined under a microscope.    How is this treated?  Treatment varies depending on the type of vaginitis you have. Your treatment may include:  · Antibiotic creams or pills to treat bacterial vaginosis and trichomoniasis.  · Antifungal medicines, such as vaginal creams or suppositories, to treat a yeast infection.  · Medicine to ease discomfort if you have viral vaginitis. Your sexual partner should also be treated.  · Estrogen delivered in a cream, pill, suppository, or vaginal ring to treat atrophic vaginitis. If vaginal dryness occurs, lubricants and moisturizing creams may help. You may need to avoid scented soaps, sprays, or douches.  · Stopping use of a product that is causing allergic vaginitis. Then using a vaginal cream to treat the symptoms.    Follow these instructions at home:  Lifestyle  · Keep your genital area clean and dry. Avoid soap, and only rinse the area with water.  · Do not douche or use tampons until your health care provider says it is okay to do so. Use sanitary pads, if needed.  · Do not have sex until your health care provider approves. When you can return to sex, practice safe sex and  use condoms.  · Wipe from front to back. This avoids the spread of bacteria from the rectum to the vagina.  General instructions  · Take over-the-counter and prescription medicines only as told by your health care provider.  · If you were prescribed an antibiotic medicine, take or use it as told by your health care provider. Do not stop taking or using the antibiotic even if you start to feel better.  ·   Keep all follow-up visits as told by your health care provider. This is important.  How is this prevented?  · Use mild, non-scented products. Do not use things that can irritate the vagina, such as fabric softeners. Avoid the following products if they are scented:  ? Feminine sprays.  ? Detergents.  ? Tampons.  ? Feminine hygiene products.  ? Soaps or bubble baths.  · Let air reach your genital area.  ? Wear cotton underwear to reduce moisture buildup.  ? Avoid wearing underwear while you sleep.  ? Avoid wearing tight pants and underwear or nylons without a cotton panel.  ? Avoid wearing thong underwear.  · Take off any wet clothing, such as bathing suits, as soon as possible.  · Practice safe sex and use condoms.  Contact a health care provider if:  · You have abdominal pain.  · You have a fever.  · You have symptoms that last for more than 2-3 days.  Get help right away if:  · You have a fever and your symptoms suddenly get worse.  Summary  · Vaginitis is a condition in which the vaginal tissue becomes inflamed.This condition is most often caused by a change in the normal balance of bacteria and yeast that live in the vagina.  · Treatment varies depending on the type of vaginitis you have.  · Do not douche, use tampons , or have sex until your health care provider approves. When you can return to sex, practice safe sex and use condoms.  This information is not intended to replace advice given to you by your health care provider. Make sure you discuss any questions you have with your health care provider.  Document  Released: 09/25/2007 Document Revised: 01/03/2017 Document Reviewed: 01/03/2017  Elsevier Interactive Patient Education © 2018 [employer] Inc.

## 2018-02-12 NOTE — Progress Notes (Signed)
GYNECOLOGY  VISIT   HPI: 57 y.o.   Married  Caucasian  female   G0P0000 with Patient's last menstrual period was 12/12/2014 (approximate).   here for vaginal discharge. No odor.  No burning or itching.   Had BV noted on pap in December 2018.      Did both oral Flagyl and Metrogel following this.   Missed one dose of Metrogel, and is asking is this is the cause of recurrent vaginal discharge.   Had positive Affirm for BV in April 2018.    States she is using [facility] brand gel of Vagisil feminine wash, and using some internally in the vagina.   Seen by her oncologist on 09/20/17 and switched to Tamoxifen 2 days later.  Started with her vaginitis symptoms 2 days after her GYN office visit for incontinence on 09/25/17.  GYNECOLOGIC HISTORY: Patient's last menstrual period was 12/12/2014 (approximate). Contraception:  Postmenopausal Menopausal hormone therapy:  none Last mammogram:  12/20/17 BIRADS 2 benign Last pap smear:   11/17/17 Pap and HR HPV negative        OB History    Gravida Para Term Preterm AB Living   0 0 0 0 0 0   SAB TAB Ectopic Multiple Live Births   0 0 0 0 0         There are no active problems to display for this patient.   Past Medical History:  Diagnosis Date  . Abnormal Pap smear of cervix 1990   Posible cryotherapy.  . Cancer ([place]) 03/02/2017   left breast cancer  . Fibroids   . HSV-1 infection   . 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 BREAST SURGERY  03/02/2017   left lumpectomy--[facility]  . MELANOMA EXCISION     left tricep    Current Outpatient Medications  Medication Sig Dispense Refill  . diclofenac (VOLTAREN) 75 MG EC tablet Take by mouth.    . Diclofenac Sodium (PENNSAID) 2 % SOLN as needed.    . diphenhydrAMINE (BENADRYL) 25 mg capsule Take by mouth.    . rosuvastatin (CRESTOR) 10 MG tablet Take by mouth daily.    . tamoxifen (NOLVADEX) 20 MG tablet Take by mouth daily.    Marland Kitchen acyclovir  (ZOVIRAX) 800 MG tablet as needed.    . Calcium Carbonate-Vitamin D 600-400 MG-UNIT tablet Take 1 tablet by mouth daily.  11   No current facility-administered medications for this visit.      ALLERGIES: Benzocaine; Epinephrine; Paba derivatives; Lanolin; Nickel; Prilocaine; Procaine; and Tetracaine  Family History  Problem Relation Age of Onset  . Breast cancer Mother        DCIS  . Hypertension Mother   . Diabetes Mother        borderline  . Autoimmune disease Mother        giant cell arteritis  . Thyroid disease Mother        hypothyrodism  . Hypertension Father   . Hypertension Sister   . Thyroid disease Sister        hypothyroidism  . Hypertension Brother     Social History   Socioeconomic History  . Marital status: Married    Spouse name: Not on file  . Number of children: Not on file  . Years of education: Not on file  . Highest education level: Not on file  Social Needs  . Financial resource strain: Not on file  . Food insecurity -  worry: Not on file  . Food insecurity - inability: Not on file  . Transportation needs - medical: Not on file  . Transportation needs - non-medical: Not on file  Occupational History  . Not on file  Tobacco Use  . Smoking status: Never Smoker  . Smokeless tobacco: Never Used  Substance and Sexual Activity  . Alcohol use: No  . Drug use: No  . Sexual activity: No    Birth control/protection: Abstinence, Post-menopausal  Other Topics Concern  . Not on file  Social History Narrative  . Not on file    ROS:  Pertinent items are noted in HPI.  PHYSICAL EXAMINATION:    BP 114/68 (BP Location: Right Arm, Patient Position: Sitting, Cuff Size: Normal)   Pulse 72   Resp 16   Wt 113 lb (51.3 kg)   LMP 12/12/2014 (Approximate)   BMI 21.18 kg/m     General appearance: alert, cooperative and appears stated age  Pelvic: External genitalia:  no lesions              Urethra:  normal appearing urethra with no masses, tenderness or  lesions              Bartholins and Skenes: normal                 Vagina: normal appearing vagina with normal color and discharge, no lesions              Cervix: no lesions                Bimanual Exam:  Uterus:  normal size, contour, position, consistency, mobility, non-tender              Adnexa: no mass, fullness, tenderness       Chaperone was present for exam.  ASSESSMENT  Vaginitis.  Hx vaginal atrophy and bacterial vaginosis.  On Tamoxifen.  PLAN  We reviewed vaginal discharge and vaginitis.  Affirm taken.  She will avoid use of cleansing products in the vagina. She understands that Tamoxifen can cause vaginal discharge but that it does not cause BV. Follow up prn.      An After Visit Summary was printed and given to the patient.  _15____ minutes face to face time of which over 50% was spent in counseling.

## 2018-02-13 LAB — VAGINITIS/VAGINOSIS, DNA PROBE
CANDIDA SPECIES: NEGATIVE
Gardnerella vaginalis: POSITIVE — AB
Trichomonas vaginosis: NEGATIVE

## 2018-02-14 ENCOUNTER — Other Ambulatory Visit: Payer: Self-pay | Admitting: *Deleted

## 2018-02-14 MED ORDER — METRONIDAZOLE 0.75 % VA GEL: 1.0000 | Freq: Every day | VAGINAL | 0 refills | Status: AC

## 2018-03-22 DIAGNOSIS — M25511 Pain in right shoulder: Secondary | ICD-10-CM | POA: Insufficient documentation

## 2018-03-22 DIAGNOSIS — M65341 Trigger finger, right ring finger: Secondary | ICD-10-CM | POA: Insufficient documentation

## 2018-04-25 HISTORY — PX: TRIGGER FINGER RELEASE: SHX641

## 2018-06-20 DIAGNOSIS — Z8742 Personal history of other diseases of the female genital tract: Secondary | ICD-10-CM | POA: Insufficient documentation

## 2018-07-30 ENCOUNTER — Encounter: Payer: Self-pay | Admitting: Obstetrics and Gynecology

## 2018-07-30 ENCOUNTER — Telehealth: Payer: Self-pay | Admitting: Obstetrics and Gynecology

## 2018-07-30 NOTE — Telephone Encounter (Signed)
Spoke with patient and she feels bacterial vaginosis has returned.   Advised not to self treat at this time.  Needs office visit to ensure yeast vs bacterial vaginosis.  Pt agreeable.  No fevers or pelvic pain.  Office visit on Wednesday 08/01/18 with Dr.Silva scheduled.  Encounter closed.

## 2018-07-30 NOTE — Telephone Encounter (Signed)
Patient sent the following message through [place]. Routing to triage to assist patient with request.   Hi,    It seems the BV has returned. )-:   I have metro gel left over. Should I do a week's tx, or would it an exam be advisable?    Thx!    Tonya Yang

## 2018-08-01 ENCOUNTER — Other Ambulatory Visit: Payer: Self-pay

## 2018-08-01 ENCOUNTER — Ambulatory Visit: Payer: Commercial Managed Care - PPO | Admitting: Obstetrics and Gynecology

## 2018-08-01 ENCOUNTER — Encounter: Payer: Self-pay | Admitting: Obstetrics and Gynecology

## 2018-08-01 VITALS — BP 118/62 | HR 74 | Resp 14 | Ht 61.0 in | Wt 110.2 lb

## 2018-08-01 DIAGNOSIS — N760 Acute vaginitis: Secondary | ICD-10-CM

## 2018-08-01 NOTE — Progress Notes (Signed)
GYNECOLOGY  VISIT   HPI: 57 y.o.   Married  Caucasian  female   G0P0000 with Patient's last menstrual period was 12/12/2014 (approximate).   here for vaginal irritation.  Symptoms for the last month.  Sticky, yucky discharge per patient.  Notes odor.  No burning.  No itching.   Hx BV and vaginal atrophy.  Last treated in March 2019.   Not sexually active.   On Tamoxifen.   Having weight gain.   GYNECOLOGIC HISTORY: Patient's last menstrual period was 12/12/2014 (approximate). Contraception:  Postmenopausal Menopausal hormone therapy:  none Last mammogram:  12/20/17 BIRADS 2 benign Last pap smear:   11/17/17 Neg:Neg HR HPV        OB History    Gravida  0   Para  0   Term  0   Preterm  0   AB  0   Living  0     SAB  0   TAB  0   Ectopic  0   Multiple  0   Live Births  0              There are no active problems to display for this patient.   Past Medical History:  Diagnosis Date  . Abnormal Pap smear of cervix 1990   Posible cryotherapy.  . Cancer ([place]) 03/02/2017   left breast cancer  . Fibroids   . HSV-1 infection   . 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 BREAST SURGERY  03/02/2017   left lumpectomy--[facility]  . MELANOMA EXCISION     left tricep  . TRIGGER FINGER RELEASE Right 04/25/2018   right ring finger    Current Outpatient Medications  Medication Sig Dispense Refill  . diclofenac (VOLTAREN) 75 MG EC tablet Take by mouth.    . Diclofenac Sodium (PENNSAID) 2 % SOLN as needed.    . rosuvastatin (CRESTOR) 10 MG tablet Take by mouth daily.    . tamoxifen (NOLVADEX) 20 MG tablet Take by mouth daily.     No current facility-administered medications for this visit.      ALLERGIES: Benzocaine; Epinephrine; Paba derivatives; Lanolin; Nickel; Prilocaine; Procaine; and Tetracaine  Family History  Problem Relation Age of Onset  . Breast cancer Mother        DCIS  . Hypertension Mother    . Diabetes Mother        borderline  . Autoimmune disease Mother        giant cell arteritis  . Thyroid disease Mother        hypothyrodism  . Hypertension Father   . Hypertension Sister   . Thyroid disease Sister        hypothyroidism  . Hypertension Brother     Social History   Socioeconomic History  . Marital status: Married    Spouse name: Not on file  . Number of children: Not on file  . Years of education: Not on file  . Highest education level: Not on file  Occupational History  . Not on file  Social Needs  . Financial resource strain: Not on file  . Food insecurity:    Worry: Not on file    Inability: Not on file  . Transportation needs:    Medical: Not on file    Non-medical: Not on file  Tobacco Use  . Smoking status: Never Smoker  . Smokeless tobacco: Never Used  Substance and Sexual Activity  .  Alcohol use: No  . Drug use: No  . Sexual activity: Never    Birth control/protection: Abstinence, Post-menopausal  Lifestyle  . Physical activity:    Days per week: Not on file    Minutes per session: Not on file  . Stress: Not on file  Relationships  . Social connections:    Talks on phone: Not on file    Gets together: Not on file    Attends religious service: Not on file    Active member of club or organization: Not on file    Attends meetings of clubs or organizations: Not on file    Relationship status: Not on file  . Intimate partner violence:    Fear of current or ex partner: Not on file    Emotionally abused: Not on file    Physically abused: Not on file    Forced sexual activity: Not on file  Other Topics Concern  . Not on file  Social History Narrative  . Not on file    Review of Systems  Constitutional: Negative.   HENT: Negative.   Eyes: Negative.   Respiratory: Negative.   Cardiovascular: Negative.   Gastrointestinal: Negative.   Endocrine: Negative.   Genitourinary: Positive for vaginal discharge.       Vaginal irritation   Musculoskeletal: Negative.   Allergic/Immunologic: Negative.   Neurological: Negative.   Hematological: Negative.   Psychiatric/Behavioral: Negative.     PHYSICAL EXAMINATION:    BP 118/62 (BP Location: Right Arm, Patient Position: Sitting, Cuff Size: Normal)   Pulse 74   Resp 14   Ht 5\' 1"  (1.549 m)   Wt 110 lb 4 oz (50 kg)   LMP 12/12/2014 (Approximate)   BMI 20.83 kg/m     General appearance: alert, cooperative and appears stated age    Pelvic: External genitalia:  Hymenal atrophy noted.               Urethra:  normal appearing urethra with no masses, tenderness or lesions              Bartholins and Skenes: normal                 Vagina: normal appearing vagina with normal color and discharge, no lesions              Cervix: no lesions                Bimanual Exam:  Uterus:  normal size, contour, position, consistency, mobility, non-tender              Adnexa: no mass, fullness, tenderness              Chaperone was present for exam.  ASSESSMENT  Vaginitis.  Hx recurrent BV.  On Tamoxifen.   PLAN  Affirm testing.  If BV confirmed, will do Flagyl 500 mg po bid x 7 days and then boric acid 600 mg vaginally for one month.  She would then need to come in for testing 48 hours after her last dose of boric acid.  If she tests negative for BV, would then do Metrogel twice weekly for 4 - 6 months.    An After Visit Summary was printed and given to the patient.  _15_____ minutes face to face time of which over 50% was spent in counseling.

## 2018-08-01 NOTE — Patient Instructions (Signed)

## 2018-08-02 LAB — VAGINITIS/VAGINOSIS, DNA PROBE
CANDIDA SPECIES: NEGATIVE
Gardnerella vaginalis: POSITIVE — AB
TRICHOMONAS VAG: NEGATIVE

## 2018-08-03 ENCOUNTER — Telehealth: Payer: Self-pay | Admitting: Emergency Medicine

## 2018-08-03 MED ORDER — NONFORMULARY OR COMPOUNDED ITEM: 0 refills | Status: DC

## 2018-08-03 MED ORDER — METRONIDAZOLE 500 MG PO TABS: 500.0000 mg | ORAL_TABLET | Freq: Two times a day (BID) | ORAL | 0 refills | Status: DC

## 2018-08-03 NOTE — Telephone Encounter (Signed)
-----   Message from Nunzio Cobbs, MD sent at 08/03/2018 10:01 AM EDT ----- Please inform patient of results showing BV.  I am recommending Flagyl 500 mg po bid x 7 days.  ETOH precautions.   Then she will do a month course of boric acid vaginal suppositories, 600 mg, per vagina, at hs.  48 hours after her last dose, she will come into the office for me to do another Affirm.  If is is negative, she will start a 4 - 6 month course of Metrogel twice weekly.   Patient is anticipating this plan.

## 2018-08-03 NOTE — Telephone Encounter (Signed)
Spoke with patinet and message per Dr. Quincy Simmonds and instructions given.  She is aware of instructions for Flagyl and Boric acid. Will pick up Rx at [facility].   She will call back with two weeks left of boric acid capsules to schedule office visit with Dr. Quincy Simmonds for repeat affirm.  Encounter closed.

## 2018-08-03 NOTE — Telephone Encounter (Signed)
Message left to return call to [place] at [phone].  [facility].  Boric Acid to [facility].

## 2018-08-07 ENCOUNTER — Encounter: Payer: Self-pay | Admitting: Obstetrics and Gynecology

## 2018-09-03 ENCOUNTER — Encounter: Payer: Self-pay | Admitting: Obstetrics and Gynecology

## 2018-09-05 ENCOUNTER — Ambulatory Visit: Payer: Commercial Managed Care - PPO | Admitting: Obstetrics and Gynecology

## 2018-09-05 ENCOUNTER — Encounter: Payer: Self-pay | Admitting: Obstetrics and Gynecology

## 2018-09-05 VITALS — BP 118/64 | HR 67 | Resp 14 | Ht 61.0 in | Wt 111.0 lb

## 2018-09-05 DIAGNOSIS — N950 Postmenopausal bleeding: Secondary | ICD-10-CM | POA: Diagnosis not present

## 2018-09-05 DIAGNOSIS — N760 Acute vaginitis: Secondary | ICD-10-CM

## 2018-09-05 NOTE — Progress Notes (Signed)
GYNECOLOGY  VISIT   HPI: 57 y.o.   Married  Caucasian  female   G0P0000 with Patient's last menstrual period was 12/12/2014 (approximate).   here for   A medication follow up Boric acid. Patient states that she is still having some discharge.  Took Flagyl 500 mg po bid for 7 days and then did a course of boric acid vaginal suppositories 600 mg by at hs for one month.  Last dosage was 4 days ago.  Feels like her symptoms are 90% better.   Not sure if she has had some vaginal bleeding, prior to using the boric acid suppositories.  She is on Tamoxifen.   GYNECOLOGIC HISTORY: Patient's last menstrual period was 12/12/2014 (approximate). Contraception:  Post menopausal  Menopausal hormone therapy:  none Last mammogram: 12/30/17 Bi-rads  2 benign  Last pap smear:   12/07/17 Neg Neg HR HPV         OB History    Gravida  0   Para  0   Term  0   Preterm  0   AB  0   Living  0     SAB  0   TAB  0   Ectopic  0   Multiple  0   Live Births  0              There are no active problems to display for this patient.   Past Medical History:  Diagnosis Date  . Abnormal Pap smear of cervix 1990   Posible cryotherapy.  . Cancer ([place]) 03/02/2017   left breast cancer  . Fibroids   . HSV-1 infection   . 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 BREAST SURGERY  03/02/2017   left lumpectomy--[facility]  . MELANOMA EXCISION     left tricep  . TRIGGER FINGER RELEASE Right 04/25/2018   right ring finger    Current Outpatient Medications  Medication Sig Dispense Refill  . diclofenac (VOLTAREN) 75 MG EC tablet Take by mouth.    . Diclofenac Sodium (PENNSAID) 2 % SOLN as needed.    . rosuvastatin (CRESTOR) 10 MG tablet Take by mouth daily.    . tamoxifen (NOLVADEX) 20 MG tablet Take by mouth daily.     No current facility-administered medications for this visit.      ALLERGIES: Benzocaine; Epinephrine; Paba derivatives; Lanolin;  Nickel; Prilocaine; Procaine; and Tetracaine  Family History  Problem Relation Age of Onset  . Breast cancer Mother        DCIS  . Hypertension Mother   . Diabetes Mother        borderline  . Autoimmune disease Mother        giant cell arteritis  . Thyroid disease Mother        hypothyrodism  . Hypertension Father   . Hypertension Sister   . Thyroid disease Sister        hypothyroidism  . Hypertension Brother     Social History   Socioeconomic History  . Marital status: Married    Spouse name: Not on file  . Number of children: Not on file  . Years of education: Not on file  . Highest education level: Not on file  Occupational History  . Not on file  Social Needs  . Financial resource strain: Not on file  . Food insecurity:    Worry: Not on file    Inability: Not on file  .  Transportation needs:    Medical: Not on file    Non-medical: Not on file  Tobacco Use  . Smoking status: Never Smoker  . Smokeless tobacco: Never Used  Substance and Sexual Activity  . Alcohol use: No  . Drug use: No  . Sexual activity: Never    Birth control/protection: Abstinence, Post-menopausal  Lifestyle  . Physical activity:    Days per week: Not on file    Minutes per session: Not on file  . Stress: Not on file  Relationships  . Social connections:    Talks on phone: Not on file    Gets together: Not on file    Attends religious service: Not on file    Active member of club or organization: Not on file    Attends meetings of clubs or organizations: Not on file    Relationship status: Not on file  . Intimate partner violence:    Fear of current or ex partner: Not on file    Emotionally abused: Not on file    Physically abused: Not on file    Forced sexual activity: Not on file  Other Topics Concern  . Not on file  Social History Narrative  . Not on file    Review of Systems  Genitourinary:       Unscheduled bleeding    PHYSICAL EXAMINATION:    BP 118/64   Pulse  67   Resp 14   Ht 5\' 1"  (1.549 m)   Wt 111 lb (50.3 kg)   LMP 12/12/2014 (Approximate)   BMI 20.97 kg/m     General appearance: alert, cooperative and appears stated age   Pelvic: External genitalia:  no lesions              Urethra:  normal appearing urethra with no masses, tenderness or lesions              Bartholins and Skenes: normal                 Vagina:  Inflammation of the hymen with small petechiae.               Cervix: no lesions                Bimanual Exam:  Uterus:  normal size, contour, position, consistency, mobility, non-tender              Adnexa: no mass, fullness, tenderness               Chaperone was present for exam.  ASSESSMENT  Recurrent bacterial vaginosis.  Potential postmenopausal bleeding. On Tamoxifen.  PLAN  Affirm testing.  If negative for BV, will do Metrogel pv twice a week for 4 - 6 months.  If positive for BV, will do Tindamax treatment and then return for retesting again 48 hours after last dosage. We reviewed possible effects of Tamoxifen on the endometrium.  Return for pelvic US and possible EMB.   An After Visit Summary was printed and given to the patient.  __15____ minutes face to face time of which over 50% was spent in counseling.

## 2018-09-06 LAB — VAGINITIS/VAGINOSIS, DNA PROBE
CANDIDA SPECIES: NEGATIVE
GARDNERELLA VAGINALIS: POSITIVE — AB
TRICHOMONAS VAG: NEGATIVE

## 2018-09-07 ENCOUNTER — Encounter: Payer: Self-pay | Admitting: Obstetrics and Gynecology

## 2018-09-07 ENCOUNTER — Other Ambulatory Visit: Payer: Self-pay | Admitting: Obstetrics and Gynecology

## 2018-09-07 MED ORDER — TINIDAZOLE 500 MG PO TABS: ORAL_TABLET | ORAL | 0 refills | Status: DC

## 2018-09-08 DIAGNOSIS — N760 Acute vaginitis: Secondary | ICD-10-CM | POA: Insufficient documentation

## 2018-09-08 DIAGNOSIS — N950 Postmenopausal bleeding: Secondary | ICD-10-CM | POA: Insufficient documentation

## 2018-09-10 ENCOUNTER — Telehealth: Payer: Self-pay | Admitting: Obstetrics and Gynecology

## 2018-09-10 NOTE — Telephone Encounter (Signed)
Spoke with patient regarding benefit for ultrasound with possible endometrial biopsy. Patient understood and agreeable. Patient ready to schedule. Patient scheduled 09/13/18 with Dr Quincy Simmonds. Patient aware of appointment date, arrival time and  cancellation policy. No further questions. Ok to close

## 2018-09-11 ENCOUNTER — Encounter: Payer: Self-pay | Admitting: Obstetrics and Gynecology

## 2018-09-11 ENCOUNTER — Telehealth: Payer: Self-pay | Admitting: Obstetrics and Gynecology

## 2018-09-11 NOTE — Telephone Encounter (Signed)
Message   Hi, Dr. Quincy Simmonds!    I don't want to be high maintenance, but, if a biopsy is needed after the ultrasound, can we schedule it for the following week? I don't think I can handle two painful procedures in one day (just the exam last week was very painful).    I understand there will be another co-pay with a separate visit, but if it's okay with you, I'd prefer some time in between them.     Jodi Mourning

## 2018-09-11 NOTE — Telephone Encounter (Signed)
Spoke with patient, advised as seen below per Dr. Quincy Simmonds. Patient verbalizes understanding and is agreeable. Thankful for return call.   Encounter closed.

## 2018-09-11 NOTE — Telephone Encounter (Signed)
Dr. Quincy Simmonds -ok to proceed with PUS on 10/3 and discuss EMB and schedule at that time?

## 2018-09-11 NOTE — Telephone Encounter (Signed)
[place] for pelvic US on 09/13/18.  She may or may not need an endometrial biopsy based on the results.  EMB could be done the following week if necessary.

## 2018-09-13 ENCOUNTER — Encounter: Payer: Self-pay | Admitting: Obstetrics and Gynecology

## 2018-09-13 ENCOUNTER — Ambulatory Visit (INDEPENDENT_AMBULATORY_CARE_PROVIDER_SITE_OTHER): Payer: Commercial Managed Care - PPO

## 2018-09-13 ENCOUNTER — Ambulatory Visit (INDEPENDENT_AMBULATORY_CARE_PROVIDER_SITE_OTHER): Payer: Commercial Managed Care - PPO | Admitting: Obstetrics and Gynecology

## 2018-09-13 VITALS — BP 134/80 | HR 83 | Wt 107.0 lb

## 2018-09-13 DIAGNOSIS — D219 Benign neoplasm of connective and other soft tissue, unspecified: Secondary | ICD-10-CM

## 2018-09-13 DIAGNOSIS — N760 Acute vaginitis: Secondary | ICD-10-CM | POA: Diagnosis not present

## 2018-09-13 DIAGNOSIS — N950 Postmenopausal bleeding: Secondary | ICD-10-CM

## 2018-09-13 NOTE — Progress Notes (Signed)
GYNECOLOGY  VISIT   HPI: 57 y.o.   Married  Caucasian  female   G0P0000 with Patient's last menstrual period was 12/12/2014 (approximate).   here for ultrasound follow up for possible post menopausal bleeding.    Patient uncertain if she had some vaginal bleeding prior to using boric acid vaginal suppositories for BV. Dealing with recurrent BV.  Currently taking Tindamax for 5 days.  She will come in for a recheck next week.   GYNECOLOGIC HISTORY: Patient's last menstrual period was 12/12/2014 (approximate). Contraception:  postmenopausal Menopausal hormone therapy:  none Last mammogram:  12/30/17 Bi-rads  2 benign  Last pap smear:  12/07/17 Neg Neg HR HPV          OB History    Gravida  0   Para  0   Term  0   Preterm  0   AB  0   Living  0     SAB  0   TAB  0   Ectopic  0   Multiple  0   Live Births  0              Patient Active Problem List   Diagnosis Date Noted  . Postmenopausal bleeding 09/08/2018  . Recurrent vaginitis 09/08/2018  . H/O abnormal cervical Papanicolaou smear 06/20/2018  . Acquired trigger finger of right ring finger 03/22/2018  . Pain in joint of right shoulder 03/22/2018  . Hypercholesterolemia 07/20/2017  . 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 Malignant neoplasm of overlapping sites of left breast in female, estrogen receptor positive ([place]) 01/09/2017  . Dysplastic nevus 06/27/2016  . Insomnia 01/08/2014  . Anxiety 01/08/2014  . Rhytides 11/26/2013  . Alopecia 07/11/2013  . Allergic rhinitis 10/20/2011  . Vitamin D deficiency 06/07/2011  . Malignant melanoma of skin ([place]) 03/28/2011  . Benign neoplasm of skin 02/07/2011    Past Medical History:  Diagnosis Date  . Abnormal Pap smear of cervix 1990   Posible cryotherapy.  . Cancer ([place]) 03/02/2017   left breast cancer  . Fibroids   . HSV-1 infection   . 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 BREAST SURGERY  03/02/2017   left lumpectomy--[facility]  . MELANOMA EXCISION     left tricep  . TRIGGER FINGER RELEASE Right 04/25/2018   right ring finger    Current Outpatient Medications  Medication Sig Dispense Refill  . diclofenac (VOLTAREN) 75 MG EC tablet Take by mouth.    . Diclofenac Sodium (PENNSAID) 2 % SOLN as needed.    . rosuvastatin (CRESTOR) 10 MG tablet Take by mouth daily.    . tamoxifen (NOLVADEX) 20 MG tablet Take by mouth daily.    Marland Kitchen tinidazole (TINDAMAX) 500 MG tablet Take 2 tablets (1000 mg) by mouth daily for 5 days. 10 tablet 0   No current facility-administered medications for this visit.      ALLERGIES: Benzocaine; Epinephrine; Paba derivatives; Lanolin; Nickel; Prilocaine; Procaine; and Tetracaine  Family History  Problem Relation Age of Onset  . Breast cancer Mother        DCIS  . Hypertension Mother   . Diabetes Mother        borderline  . Autoimmune disease Mother        giant cell arteritis  . Thyroid disease Mother        hypothyrodism  . Hypertension Father   .  Hypertension Sister   . Thyroid disease Sister        hypothyroidism  . Hypertension Brother     Social History   Socioeconomic History  . Marital status: Married    Spouse name: Not on file  . Number of children: Not on file  . Years of education: Not on file  . Highest education level: Not on file  Occupational History  . Not on file  Social Needs  . Financial resource strain: Not on file  . Food insecurity:    Worry: Not on file    Inability: Not on file  . Transportation needs:    Medical: Not on file    Non-medical: Not on file  Tobacco Use  . Smoking status: Never Smoker  . Smokeless tobacco: Never Used  Substance and Sexual Activity  . Alcohol use: No  . Drug use: No  . Sexual activity: Never    Birth control/protection: Abstinence, Post-menopausal  Lifestyle  . Physical activity:    Days per week: Not on file    Minutes per session: Not on file  .  Stress: Not on file  Relationships  . Social connections:    Talks on phone: Not on file    Gets together: Not on file    Attends religious service: Not on file    Active member of club or organization: Not on file    Attends meetings of clubs or organizations: Not on file    Relationship status: Not on file  . Intimate partner violence:    Fear of current or ex partner: Not on file    Emotionally abused: Not on file    Physically abused: Not on file    Forced sexual activity: Not on file  Other Topics Concern  . Not on file  Social History Narrative  . Not on file    Review of Systems  PHYSICAL EXAMINATION:    BP 134/80   Pulse 83   Wt 107 lb (48.5 kg)   LMP 12/12/2014 (Approximate)   SpO2 96%   BMI 20.22 kg/m     General appearance: alert, cooperative and appears stated age   Uterus with 4 fibroids - largest 29 mm.  Degeneration noted. EMS 2.21 mm, symmetrical, no masses. Ovaries normal.  No free fluid.  ASSESSMENT  Possible postmenopausal bleeding on Tamoxifen.  Fibroids noted.  Recurrent bacterial vaginosis, in treatment with Tindamax.   PLAN  Reassurance regarding pelvic US findings.  We discussed Tamoxifen effects on endometrium.  If any future spotting or bleeding, will have sonohysterogram and EMB done.   She understands the importance of this.  We reviewed fibroids.  FU in 5 days for vaginitis recheck.  If no BV, will do Metrogel pv at hs twice weekly for 4 - 6 months.    An After Visit Summary was printed and given to the patient.  __15____ minutes face to face time of which over 50% was spent in counseling.

## 2018-09-17 NOTE — Progress Notes (Signed)
GYNECOLOGY  VISIT   HPI: 57 y.o.   Married  Caucasian  female   G0P0000 with Patient's last menstrual period was 12/12/2014 (approximate).   here for   Vaginitis recheck   Took last Tindamax 3 mornings ago.   Notes discharge but no odor.   No more vaginal bleeding.  Had a normal pelvic US.  GYNECOLOGIC HISTORY: Patient's last menstrual period was 12/12/2014 (approximate). Contraception:  Postmenopausal  Menopausal hormone therapy:  none Last mammogram:  12-30-17 BIRADS 2 benign  Last pap smear:  11-17-17 negative, HR HPV negative                                        OB History    Gravida  0   Para  0   Term  0   Preterm  0   AB  0   Living  0     SAB  0   TAB  0   Ectopic  0   Multiple  0   Live Births  0              Patient Active Problem List   Diagnosis Date Noted  . Postmenopausal bleeding 09/08/2018  . Recurrent vaginitis 09/08/2018  . H/O abnormal cervical Papanicolaou smear 06/20/2018  . Acquired trigger finger of right ring finger 03/22/2018  . Pain in joint of right shoulder 03/22/2018  . Hypercholesterolemia 07/20/2017  . 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 Malignant neoplasm of overlapping sites of left breast in female, estrogen receptor positive ([place]) 01/09/2017  . Dysplastic nevus 06/27/2016  . Insomnia 01/08/2014  . Anxiety 01/08/2014  . Rhytides 11/26/2013  . Alopecia 07/11/2013  . Allergic rhinitis 10/20/2011  . Vitamin D deficiency 06/07/2011  . Malignant melanoma of skin ([place]) 03/28/2011  . Benign neoplasm of skin 02/07/2011    Past Medical History:  Diagnosis Date  . Abnormal Pap smear of cervix 1990   Posible cryotherapy.  . Cancer ([place]) 03/02/2017   left breast cancer  . Fibroids   . HSV-1 infection   . Melanoma ([place])    left tricep    Past Surgical History:  Procedure Laterality Date  . BREAST SURGERY  03/02/2017   left lumpectomy--[facility]  . MELANOMA EXCISION     left tricep  . TRIGGER  FINGER RELEASE Right 04/25/2018   right ring finger    Current Outpatient Medications  Medication Sig Dispense Refill  . diclofenac (VOLTAREN) 75 MG EC tablet Take by mouth.    . Diclofenac Sodium (PENNSAID) 2 % SOLN as needed.    . rosuvastatin (CRESTOR) 10 MG tablet Take by mouth daily.    . tamoxifen (NOLVADEX) 20 MG tablet Take by mouth daily.     No current facility-administered medications for this visit.      ALLERGIES: Benzocaine; Epinephrine; Paba derivatives; Lanolin; Nickel; Prilocaine; Procaine; and Tetracaine  Family History  Problem Relation Age of Onset  . Breast cancer Mother        DCIS  . Hypertension Mother   . Diabetes Mother        borderline  . Autoimmune disease Mother        giant cell arteritis  . Thyroid disease Mother        hypothyrodism  . Hypertension Father   . Hypertension Sister   . Thyroid disease Sister  hypothyroidism  . Hypertension Brother     Social History   Socioeconomic History  . Marital status: Married    Spouse name: Not on file  . Number of children: Not on file  . Years of education: Not on file  . Highest education level: Not on file  Occupational History  . Not on file  Social Needs  . Financial resource strain: Not on file  . Food insecurity:    Worry: Not on file    Inability: Not on file  . Transportation needs:    Medical: Not on file    Non-medical: Not on file  Tobacco Use  . Smoking status: Never Smoker  . Smokeless tobacco: Never Used  Substance and Sexual Activity  . Alcohol use: No  . Drug use: No  . Sexual activity: Never    Birth control/protection: Abstinence, Post-menopausal  Lifestyle  . Physical activity:    Days per week: Not on file    Minutes per session: Not on file  . Stress: Not on file  Relationships  . Social connections:    Talks on phone: Not on file    Gets together: Not on file    Attends religious service: Not on file    Active member of club or organization: Not  on file    Attends meetings of clubs or organizations: Not on file    Relationship status: Not on file  . Intimate partner violence:    Fear of current or ex partner: Not on file    Emotionally abused: Not on file    Physically abused: Not on file    Forced sexual activity: Not on file  Other Topics Concern  . Not on file  Social History Narrative  . Not on file    Review of Systems  Constitutional: Negative.   HENT: Negative.   Eyes: Negative.   Respiratory: Negative.   Cardiovascular: Negative.   Gastrointestinal: Negative.   Endocrine: Negative.   Genitourinary: Positive for vaginal discharge.  Musculoskeletal: Negative.   Skin: Negative.   Allergic/Immunologic: Negative.   Neurological: Negative.   Hematological: Negative.   Psychiatric/Behavioral: Negative.     PHYSICAL EXAMINATION:    BP 120/80 (BP Location: Right Arm, Patient Position: Sitting, Cuff Size: Normal)   Pulse 72   Resp 12   Ht 5\' 1"  (1.549 m)   Wt 109 lb 1.6 oz (49.5 kg)   LMP 12/12/2014 (Approximate)   BMI 20.61 kg/m     General appearance: alert, cooperative and appears stated age  Pelvic: External genitalia:  Mild erythema of the introitus.              Urethra:  normal appearing urethra with no masses, tenderness or lesions              Bartholins and Skenes: normal                 Vagina: normal appearing vagina with normal color and discharge, no lesions              Cervix: no lesions                Bimanual Exam:  Uterus:  normal size, contour, position, consistency, mobility, non-tender              Adnexa: no mass, fullness, tenderness          Chaperone was present for exam.  ASSESSMENT  Chronic vaginitis.  (Nonpainful exam today per patient.) Status post  Flagyl, boric acid, and then Tindamax.   PLAN  Affirn testing.  She will await results and decide if she would like to do Metrogel tx twice weekly for 4 - 6 months to prevent recurrence of BV. We discussed that vaginal  discharge is normal if not associated with odor, itching, or burning.    An After Visit Summary was printed and given to the patient.  ___15___ minutes face to face time of which over 50% was spent in counseling.

## 2018-09-18 ENCOUNTER — Encounter: Payer: Self-pay | Admitting: Obstetrics and Gynecology

## 2018-09-18 ENCOUNTER — Ambulatory Visit: Payer: Commercial Managed Care - PPO | Admitting: Obstetrics and Gynecology

## 2018-09-18 ENCOUNTER — Other Ambulatory Visit: Payer: Self-pay

## 2018-09-18 VITALS — BP 120/80 | HR 72 | Resp 12 | Ht 61.0 in | Wt 109.1 lb

## 2018-09-18 DIAGNOSIS — N761 Subacute and chronic vaginitis: Secondary | ICD-10-CM

## 2018-09-19 LAB — VAGINITIS/VAGINOSIS, DNA PROBE
Candida Species: NEGATIVE
Gardnerella vaginalis: POSITIVE — AB
TRICHOMONAS VAG: NEGATIVE

## 2018-09-21 ENCOUNTER — Telehealth: Payer: Self-pay | Admitting: *Deleted

## 2018-09-21 NOTE — Telephone Encounter (Signed)
-----   Message from Nunzio Cobbs, MD sent at 09/20/2018  4:52 PM EDT ----- Please inform patient of Affirm testing showing bacterial vaginosis.  I reviewed Up to Date, and Metrogel pv twice weekly for 4 - 6 months can be used for failed therapy or for suppression. At this point, I would not retreat the acute infection with an oral medication.  I would like to offer her the Metrogel as above.

## 2018-09-21 NOTE — Telephone Encounter (Signed)
Message left to return call to Aeron Lheureux at [phone].    

## 2018-09-24 MED ORDER — METRONIDAZOLE 0.75 % VA GEL: 1.0000 | VAGINAL | 1 refills | Status: DC

## 2018-09-24 NOTE — Telephone Encounter (Signed)
Spoke with patient and message given. Verbalized understanding of instructions and use of Metrogel.  She will call back with any questions or concerns as needed.  Encounter closed.

## 2018-11-19 ENCOUNTER — Ambulatory Visit: Payer: Commercial Managed Care - PPO | Admitting: Obstetrics and Gynecology

## 2018-11-19 ENCOUNTER — Encounter: Payer: Self-pay | Admitting: Obstetrics and Gynecology

## 2018-11-19 ENCOUNTER — Other Ambulatory Visit: Payer: Self-pay

## 2018-11-19 VITALS — BP 104/68 | HR 80 | Resp 14 | Ht 61.0 in | Wt 112.2 lb

## 2018-11-19 DIAGNOSIS — Z23 Encounter for immunization: Secondary | ICD-10-CM | POA: Diagnosis not present

## 2018-11-19 DIAGNOSIS — Z01419 Encounter for gynecological examination (general) (routine) without abnormal findings: Secondary | ICD-10-CM

## 2018-11-19 NOTE — Progress Notes (Signed)
57 y.o. G56P0000 Married Caucasian female here for annual exam.    No more vaginal spotting. Had neg eval for postmenopausal bleeding.   Constipation.   Left breast still sore following breast cancer treatment.   Using Metrogel twice weekly since her visit on 09/18/18.  Having some discharge and no itching, burning, or odor.   Seeing a lipidemiologist.   PCP:  Gaetano Hawth[facility], MD   Patient's last menstrual period was 12/12/2014 (approximate).           Sexually active: No.  The current method of family planning is post menopausal status.    Exercising: Yes.    stepper and weights. Smoker:  no  Health Maintenance: Pap: 11-17-17 Neg:Neg HR HPV, 11-12-14 Neg History of abnormal Pap:  Yes, Hx of cryotherapy 1990. MMG: 01-06-17 density D/Rt.Br.neg-- Lt.Br.poss.distortion--further evaluation needed./Lt.Br.Diag w/US revealed irreg.mass at 12 and 3 o'clock--Appt. 12/2018 at [facility] Colonoscopy: 05/2017 adenoma polyp;next due 05/2020 BMD: 07/26/17    Result :07/26/17   TDaP:  10 years or more Gardasil:   no HIV: 03-27-17 NR Hep C: 03-27-17 Neg Screening Labs:  Hb today: Unsure if PCP did labs    reports that she has never smoked. She has never used smokeless tobacco. She reports that she does not drink alcohol or use drugs.  Past Medical History:  Diagnosis Date  . Abnormal Pap smear of cervix 1990   Posible cryotherapy.  . Cancer ([place]) 03/02/2017   left breast cancer  . Fibroids   . HSV-1 infection   . 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 BREAST SURGERY  03/02/2017   left lumpectomy--[facility]  . MELANOMA EXCISION     left tricep  . TRIGGER FINGER RELEASE Right 04/25/2018   right ring finger    Current Outpatient Medications  Medication Sig Dispense Refill  . clonazePAM (KLONOPIN) 0.25 MG disintegrating tablet Take 1 tablet by mouth as needed.    . diclofenac (VOLTAREN) 75 MG EC tablet Take by mouth.    . Diclofenac Sodium (PENNSAID)  2 % SOLN as needed.    . metroNIDAZOLE (METROGEL) 0.75 % vaginal gel Place 1 Applicatorful vaginally 2 (two) times a week. At bedtime. Suppression therapy for 6 months. 280 g 1  . rosuvastatin (CRESTOR) 10 MG tablet Take by mouth daily.    . tamoxifen (NOLVADEX) 20 MG tablet Take 20 mg by mouth at bedtime.     No current facility-administered medications for this visit.     Family History  Problem Relation Age of Onset  . Breast cancer Mother        DCIS  . Hypertension Mother   . Diabetes Mother        borderline  . Autoimmune disease Mother        giant cell arteritis  . Thyroid disease Mother        hypothyrodism  . Hypertension Father   . Hypertension Sister   . Thyroid disease Sister        hypothyroidism  . Hypertension Brother     Review of Systems  Gastrointestinal: Positive for constipation.  All other systems reviewed and are negative.   Exam:   BP 104/68 (BP Location: Right Arm, Patient Position: Sitting, Cuff Size: Normal)   Pulse 80   Resp 14   Ht 5\' 1"  (1.549 m)   Wt 112 lb 3.2 oz (50.9 kg)   LMP 12/12/2014 (Approximate)   BMI 21.20 kg/m  General appearance: alert, cooperative and appears stated age Head: Normocephalic, without obvious abnormality, atraumatic Neck: no adenopathy, supple, symmetrical, trachea midline and thyroid normal to inspection and palpation Lungs: clear to auscultation bilaterally Breasts: normal appearance, no masses or tenderness, No nipple retraction or dimpling, No nipple discharge or bleeding, No axillary or supraclavicular adenopathy Heart: regular rate and rhythm Abdomen: soft, non-tender; no masses, no organomegaly Extremities: extremities normal, atraumatic, no cyanosis or edema Skin: Skin color, texture, turgor normal. No rashes or lesions Lymph nodes: Cervical, supraclavicular, and axillary nodes normal. No abnormal inguinal nodes palpated Neurologic: Grossly normal  Pelvic: External genitalia:  no lesions               Urethra:  normal appearing urethra with no masses, tenderness or lesions              Bartholins and Skenes: normal                 Vagina: normal appearing vagina with normal color and discharge, no lesions              Cervix: no lesions              Pap taken: No. Bimanual Exam:  Uterus:  normal size, contour, position, consistency, mobility, non-tender              Adnexa: no mass, fullness, tenderness              Rectal exam: Yes.  .  Confirms.              Anus:  normal sphincter tone, no lesions  Chaperone was present for exam.  Assessment:   Well woman visit with normal exam. Osteopenia.  Followed at [facility].  Left breast cancer.  Status post lumpectomy and XRT. On Tamoxifen.  Vaginitis.   Recurrent BV.  Doing Metrogel long term tx.  Fibroids.  Hx HSV I.  Hx melanoma.  Plan: Mammogram screening through [facility].  Recommended self breast awareness. Pap and HR HPV as above. Guidelines for Calcium, Vitamin D, regular exercise program including cardiovascular and weight bearing exercise. Flu vaccine recommended.  Continue Metrogel to complete 4 - 6 month tx.    Follow up annually and prn.    After visit summary provided.

## 2018-11-19 NOTE — Patient Instructions (Signed)

## 2019-01-17 ENCOUNTER — Encounter: Payer: Self-pay | Admitting: Obstetrics and Gynecology

## 2019-01-17 ENCOUNTER — Telehealth: Payer: Self-pay | Admitting: Obstetrics and Gynecology

## 2019-01-17 DIAGNOSIS — N950 Postmenopausal bleeding: Secondary | ICD-10-CM

## 2019-01-17 NOTE — Telephone Encounter (Signed)
Spoke with patient. Noted blood on applicator after using Metrogel today.  Not actively bleeding.  No pain, no cramps.  Office visit for evaluation with Dr. Quincy Simmonds scheduled for 01/21/2019.  Patient thinks it may be due to dry and or irritated vaginal skin but knows Dr. Quincy Simmonds said if she had bleeding again would need a Endometrial biopsy.   Advised will send message to Dr. Quincy Simmonds.  Is office visit okay or plan for Endometrial biopsy?  Patient states if she needs procedure done will require pre-medication and she knows she will need a ride.

## 2019-01-17 NOTE — Telephone Encounter (Signed)
Patient sent the following correspondence through [facility]. Routing to triage to assist patient with request.  Dr. Quincy Simmonds,    After using the metro gel, there was what looks like blood on the applicator.     )-:    Tonya Yang  [phone]  [phone]

## 2019-01-18 MED ORDER — LORAZEPAM 0.5 MG PO TABS: ORAL_TABLET | ORAL | 0 refills | Status: DC

## 2019-01-18 NOTE — Telephone Encounter (Signed)
Call to patient. Message from Dr. Quincy Simmonds given.  Pt agreeable. Will come today and sign consent and husband will drive her.   Advised not to use both Klonopin and Ativan. Pt does not have Klonopin filled, just has on file.   Instructions given yesterday for Endometrial biopsy.   Patient agreeable to plan.  Ordered procedure for appointment on Monday.   Encounter closed.  Cc Suzy Dixon/Rosa Davis.   Encounter closed.

## 2019-01-18 NOTE — Progress Notes (Signed)
GYNECOLOGY  VISIT   HPI: 58 y.o.   Married  Caucasian  female   G0P0000 with Patient's last menstrual period was 12/12/2014 (approximate).   here for postmenopausal bleeding and endometrial biopsy.  Patient had bleeding after using Metrogel for BV suppression.  She thinks she is doing better overall since starting the Metrogel in October 2019.   Previous postmenopausal bleeding and pelvic US on 09/13/18 showing fibroids with largest measuring 29 mm, EMS 2.21 mm and symmetrical, normal ovaries and free fluid.  No EMB done at that time.    On Tamoxifen for estrogen receptor positive left breast cancer.   GYNECOLOGIC HISTORY: Patient's last menstrual period was 12/12/2014 (approximate). Contraception:  Postmenopausal Menopausal hormone therapy:  none Last mammogram: 12-20-18 Bil.Diag.--S/P left lumpectomy for Br.CA;Rt.Br.Neg Last pap smear: 11-17-17 Neg:Neg HR HPV                              11-12-14 Neg OB History    Gravida  0   Para  0   Term  0   Preterm  0   AB  0   Living  0     SAB  0   TAB  0   Ectopic  0   Multiple  0   Live Births  0              Patient Active Problem List   Diagnosis Date Noted  . Postmenopausal bleeding 09/08/2018  . Recurrent vaginitis 09/08/2018  . H/O abnormal cervical Papanicolaou smear 06/20/2018  . Acquired trigger finger of right ring finger 03/22/2018  . Pain in joint of right shoulder 03/22/2018  . Hypercholesterolemia 07/20/2017  . 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 Malignant neoplasm of overlapping sites of left breast in female, estrogen receptor positive ([place]) 01/09/2017  . Dysplastic nevus 06/27/2016  . Insomnia 01/08/2014  . Anxiety 01/08/2014  . Rhytides 11/26/2013  . Alopecia 07/11/2013  . Allergic rhinitis 10/20/2011  . Vitamin D deficiency 06/07/2011  . Malignant melanoma of skin ([place]) 03/28/2011  . Benign neoplasm of skin 02/07/2011    Past Medical History:  Diagnosis Date  .  Abnormal Pap smear of cervix 1990   Posible cryotherapy.  . Cancer ([place]) 03/02/2017   left breast cancer  . Fibroids   . HSV-1 infection   . Melanoma ([place])    left tricep    Past Surgical History:  Procedure Laterality Date  . BREAST SURGERY  03/02/2017   left lumpectomy--[facility]  . MELANOMA EXCISION     left tricep  . TRIGGER FINGER RELEASE Right 04/25/2018   right ring finger    Current Outpatient Medications  Medication Sig Dispense Refill  . diclofenac (VOLTAREN) 75 MG EC tablet Take by mouth.    . Diclofenac Sodium (PENNSAID) 2 % SOLN as needed.    Marland Kitchen LORazepam (ATIVAN) 0.5 MG tablet One tablet one hour before appointment. 2 tablet 0  . rosuvastatin (CRESTOR) 10 MG tablet Take by mouth daily.    . tamoxifen (NOLVADEX) 10 MG tablet Take 1 tablet by mouth daily.    . clonazePAM (KLONOPIN) 0.25 MG disintegrating tablet Take 1 tablet by mouth as needed.     No current facility-administered medications for this visit.      ALLERGIES: Benzocaine; Epinephrine; Paba derivatives; Lanolin; Nickel; Prilocaine; Procaine; and Tetracaine  Family History  Problem Relation Age of Onset  . Breast cancer Mother  DCIS  . Hypertension Mother   . Diabetes Mother        borderline  . Autoimmune disease Mother        giant cell arteritis  . Thyroid disease Mother        hypothyrodism  . Hypertension Father   . Hypertension Sister   . Thyroid disease Sister        hypothyroidism  . Hypertension Brother     Social History   Socioeconomic History  . Marital status: Married    Spouse name: Not on file  . Number of children: Not on file  . Years of education: Not on file  . Highest education level: Not on file  Occupational History  . Not on file  Social Needs  . Financial resource strain: Not on file  . Food insecurity:    Worry: Not on file    Inability: Not on file  . Transportation needs:    Medical: Not on file    Non-medical: Not on file  Tobacco Use  . Smoking  status: Never Smoker  . Smokeless tobacco: Never Used  Substance and Sexual Activity  . Alcohol use: No  . Drug use: No  . Sexual activity: Not Currently    Birth control/protection: Abstinence, Post-menopausal  Lifestyle  . Physical activity:    Days per week: Not on file    Minutes per session: Not on file  . Stress: Not on file  Relationships  . Social connections:    Talks on phone: Not on file    Gets together: Not on file    Attends religious service: Not on file    Active member of club or organization: Not on file    Attends meetings of clubs or organizations: Not on file    Relationship status: Not on file  . Intimate partner violence:    Fear of current or ex partner: Not on file    Emotionally abused: Not on file    Physically abused: Not on file    Forced sexual activity: Not on file  Other Topics Concern  . Not on file  Social History Narrative  . Not on file    Review of Systems  All other systems reviewed and are negative.   PHYSICAL EXAMINATION:    BP (!) 170/88 (BP Location: Right Arm, Patient Position: Sitting, Cuff Size: Normal)   Pulse 100   Ht 5' 1.5" (1.562 m)   Wt 116 lb 9.6 oz (52.9 kg)   LMP 12/12/2014 (Approximate)   BMI 21.67 kg/m     General appearance: alert, cooperative and appears stated age   Pelvic: External genitalia:  no lesions              Urethra:  normal appearing urethra with no masses, tenderness or lesions              Bartholins and Skenes: normal                 Vagina: normal appearing vagina with normal color and white discharge, no lesions              Cervix: no lesions              Pap collected: Yes.           EMB: Consent for procedure. Sterile prep with  Hibiclens Paracervical block with 1% lidocaine 6 cc, lot number  [id number]   , expiration   4/23. Tenaculum to anterior cervical lip.  Os finder used. Small and then regular pipelle passed to   7 cm, once each pipelle.  Tissue to pathology.  Minimal tissue  noted.  Minimal EBL. No complications.   Chaperone was present for exam.  ASSESSMENT  Postmenopausal bleeding.  Hx left breast cancer, estrogen sensitive.  On Tamoxifen.  Fibroids.  Recurrent BV.  On suppressive regimen of Metrogel. Atrophy.   PLAN  We discussed postmenopausal bleeding and possible etiologies.  We reviewed the potential of Tamoxifen to cause a well differentiated endometrial cancer.  FU EMB.  FU pap.  She will continue the Metrogel twice weekly for 2 more months.  AEX at the end of the year.   An After Visit Summary was printed and given to the patient.  _25_____ minutes face to face time of which over 50% was spent in counseling.

## 2019-01-18 NOTE — Telephone Encounter (Signed)
[facility] for endometrial biopsy.  If she needs premedication for anxiety, Ok for Ativan 0.5 mg one hour prior to procedure.  Dispense:  2, RF:  None.  She will need a driver to take her to appointment and home after.

## 2019-01-21 ENCOUNTER — Ambulatory Visit: Payer: Commercial Managed Care - PPO | Admitting: Obstetrics and Gynecology

## 2019-01-21 ENCOUNTER — Other Ambulatory Visit: Payer: Self-pay | Admitting: Obstetrics and Gynecology

## 2019-01-21 ENCOUNTER — Encounter: Payer: Self-pay | Admitting: Obstetrics and Gynecology

## 2019-01-21 ENCOUNTER — Other Ambulatory Visit (HOSPITAL_COMMUNITY)
Admission: RE | Admit: 2019-01-21 | Discharge: 2019-01-21 | Disposition: A | Discharge status: Home / Self Care | Payer: Commercial Managed Care - PPO | Source: Ambulatory Visit | Attending: Obstetrics and Gynecology | Admitting: Obstetrics and Gynecology

## 2019-01-21 ENCOUNTER — Other Ambulatory Visit: Payer: Self-pay

## 2019-01-21 VITALS — BP 170/88 | HR 100 | Ht 61.5 in | Wt 116.6 lb

## 2019-01-21 DIAGNOSIS — N760 Acute vaginitis: Secondary | ICD-10-CM

## 2019-01-21 DIAGNOSIS — Z124 Encounter for screening for malignant neoplasm of cervix: Secondary | ICD-10-CM

## 2019-01-21 DIAGNOSIS — N950 Postmenopausal bleeding: Secondary | ICD-10-CM

## 2019-01-21 NOTE — Patient Instructions (Signed)

## 2019-01-23 LAB — CYTOLOGY - PAP
Diagnosis: UNDETERMINED — AB
HPV: NOT DETECTED

## 2019-01-24 ENCOUNTER — Ambulatory Visit: Payer: Self-pay | Admitting: Obstetrics and Gynecology

## 2019-01-30 ENCOUNTER — Encounter: Payer: Self-pay | Admitting: Obstetrics and Gynecology

## 2019-01-30 ENCOUNTER — Telehealth: Payer: Self-pay | Admitting: Obstetrics and Gynecology

## 2019-01-30 NOTE — Telephone Encounter (Signed)
Hi,     I received a text that I had new test results. It was after the results of the Pap smear so thought it was the endometrial biopsy, but there is no other result showing. Are those results still pending?    Thx!    Hinton Dyer

## 2019-01-30 NOTE — Telephone Encounter (Signed)
Please let the patient know that everything looks benign.  I spoke with the pathologist today, because the specimen source stated that it was from her cervix, when the location was the lining of her uterus.  The pathologist stated that the cells were benign, and that he was going to issue an addendum to the report to clarify it was an endometrial biopsy.

## 2019-01-30 NOTE — Telephone Encounter (Signed)
Patient asking for Endometrial biopsy results.

## 2019-01-31 NOTE — Telephone Encounter (Signed)
Patient notified of results from Dr. Quincy Simmonds.  Will call back with any further bleeding.  Has annual exam scheduled.  Encounter closed.

## 2019-04-17 ENCOUNTER — Telehealth: Payer: Self-pay | Admitting: Obstetrics and Gynecology

## 2019-04-17 NOTE — Telephone Encounter (Signed)
Patient was treated for BV, treatment ended in April. The symptoms have returned and are now worse.

## 2019-04-17 NOTE — Telephone Encounter (Signed)
Spoke with patient. Hx of recurrent BV. Started 6 months of metrogel therapy in 09/2018, completed 03/2019. Symptoms have returned, worse than before. Reports vaginal irritation, discomfort, increase in white vaginal d/c. Denies vaginal odor or bleeding. Recommended OV for further evaluation. OV scheduled for 5/12 at 11am with Dr. Quincy Simmonds.   Routing to provider for final review. Patient is agreeable to disposition. Will close encounter.

## 2019-04-22 ENCOUNTER — Other Ambulatory Visit: Payer: Self-pay

## 2019-04-23 ENCOUNTER — Encounter: Payer: Self-pay | Admitting: Obstetrics and Gynecology

## 2019-04-23 ENCOUNTER — Ambulatory Visit: Payer: Commercial Managed Care - PPO | Admitting: Obstetrics and Gynecology

## 2019-04-23 VITALS — BP 124/70 | HR 80 | Temp 98.4°F | Ht 61.5 in | Wt 112.0 lb

## 2019-04-23 DIAGNOSIS — N760 Acute vaginitis: Secondary | ICD-10-CM

## 2019-04-23 DIAGNOSIS — N952 Postmenopausal atrophic vaginitis: Secondary | ICD-10-CM | POA: Diagnosis not present

## 2019-04-23 MED ORDER — GABAPENTIN 100 MG PO CAPS: 100.0000 mg | ORAL_CAPSULE | Freq: Three times a day (TID) | ORAL | 2 refills | Status: DC

## 2019-04-23 MED ORDER — FLUCONAZOLE 150 MG PO TABS: 150.0000 mg | ORAL_TABLET | Freq: Once | ORAL | 0 refills | Status: AC

## 2019-04-23 NOTE — Progress Notes (Signed)
GYNECOLOGY  VISIT   HPI: 58 y.o.   Married  Caucasian  female   G0P0000 with Patient's last menstrual period was 12/12/2014 (approximate).   here for recurrent BV. Having vaginal discharge - white, yellow, or brown.  No odor.  Feels uncomfortable with a little bit of itching and maybe burning.  Even uncomfortable to sit.  Having achy intermittent pain.  Feels like something is not right externally.   Feels frustrated with BV for the last 2 years.  States in April, doing Metrogel twice weekly did not control her symptoms.  Reports an increase in her symptoms in April.  She eventually stopped in April, and her symptoms increased further.  Using Metrogel since October.    Denies vaginal bleeding.    She is on Tamoxifen.   She is concerned about PID.  She has negative GC/CT in April, 2018.  No change in partner.   Not sexually active by choice.   She took Paxil in the past and developed a rash.   GYNECOLOGIC HISTORY: Patient's last menstrual period was 12/12/2014 (approximate). Contraception:  postmenopausal Menopausal hormone therapy:  none Last mammogram:  12-20-18 Bil.Diag.--S/P left lumpectomy for Br.CA;Rt.Br.Neg Last pap smear:   11-17-17 Neg:Neg HR HPV                              11-12-14 Neg        OB History    Gravida  0   Para  0   Term  0   Preterm  0   AB  0   Living  0     SAB  0   TAB  0   Ectopic  0   Multiple  0   Live Births  0              Patient Active Problem List   Diagnosis Date Noted  . Postmenopausal bleeding 09/08/2018  . Recurrent vaginitis 09/08/2018  . H/O abnormal cervical Papanicolaou smear 06/20/2018  . Acquired trigger finger of right ring finger 03/22/2018  . Pain in joint of right shoulder 03/22/2018  . Hypercholesterolemia 07/20/2017  . 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 Malignant neoplasm of overlapping sites of left breast in female, estrogen receptor positive ([place]) 01/09/2017  .  Dysplastic nevus 06/27/2016  . Insomnia 01/08/2014  . Anxiety 01/08/2014  . Rhytides 11/26/2013  . Alopecia 07/11/2013  . Allergic rhinitis 10/20/2011  . Vitamin D deficiency 06/07/2011  . Malignant melanoma of skin ([place]) 03/28/2011  . Benign neoplasm of skin 02/07/2011    Past Medical History:  Diagnosis Date  . Abnormal Pap smear of cervix 1990   Posible cryotherapy.  . Cancer ([place]) 03/02/2017   left breast cancer  . Fibroids   . HSV-1 infection   . Melanoma ([place])    left tricep    Past Surgical History:  Procedure Laterality Date  . BREAST SURGERY  03/02/2017   left lumpectomy--[facility]  . MELANOMA EXCISION     left tricep  . TRIGGER FINGER RELEASE Right 04/25/2018   right ring finger    Current Outpatient Medications  Medication Sig Dispense Refill  . atorvastatin (LIPITOR) 10 MG tablet Take by mouth.    . diclofenac (VOLTAREN) 75 MG EC tablet Take by mouth.    . Diclofenac Sodium (PENNSAID) 2 % SOLN as needed.    . Multiple Vitamins-Minerals (VITAMIN D3 COMPLETE PO) Take by mouth.    Marland Kitchen  tamoxifen (NOLVADEX) 10 MG tablet Take 1 tablet by mouth daily.     No current facility-administered medications for this visit.      ALLERGIES: Benzocaine; Epinephrine; Paba derivatives; Lanolin; Nickel; Prilocaine; Procaine; and Tetracaine  Family History  Problem Relation Age of Onset  . Breast cancer Mother        DCIS  . Hypertension Mother   . Diabetes Mother        borderline  . Autoimmune disease Mother        giant cell arteritis  . Thyroid disease Mother        hypothyrodism  . Hypertension Father   . Hypertension Sister   . Thyroid disease Sister        hypothyroidism  . Hypertension Brother     Social History   Socioeconomic History  . Marital status: Married    Spouse name: Not on file  . Number of children: Not on file  . Years of education: Not on file  . Highest education level: Not on file  Occupational History  . Not on file  Social Needs  .  Financial resource strain: Not on file  . Food insecurity:    Worry: Not on file    Inability: Not on file  . Transportation needs:    Medical: Not on file    Non-medical: Not on file  Tobacco Use  . Smoking status: Never Smoker  . Smokeless tobacco: Never Used  Substance and Sexual Activity  . Alcohol use: No  . Drug use: No  . Sexual activity: Not Currently    Birth control/protection: Abstinence, Post-menopausal  Lifestyle  . Physical activity:    Days per week: Not on file    Minutes per session: Not on file  . Stress: Not on file  Relationships  . Social connections:    Talks on phone: Not on file    Gets together: Not on file    Attends religious service: Not on file    Active member of club or organization: Not on file    Attends meetings of clubs or organizations: Not on file    Relationship status: Not on file  . Intimate partner violence:    Fear of current or ex partner: Not on file    Emotionally abused: Not on file    Physically abused: Not on file    Forced sexual activity: Not on file  Other Topics Concern  . Not on file  Social History Narrative  . Not on file    Review of Systems  Constitutional: Negative.   HENT: Negative.   Eyes: Negative.   Respiratory: Negative.   Cardiovascular: Negative.   Gastrointestinal: Negative.   Endocrine: Negative.   Genitourinary: Positive for vaginal discharge.  Musculoskeletal: Negative.   Skin: Negative.   Allergic/Immunologic: Negative.   Neurological: Negative.   Hematological: Negative.   Psychiatric/Behavioral: Negative.     PHYSICAL EXAMINATION:    Ht 5' 1.5" (1.562 m)   Wt 112 lb (50.8 kg)   LMP 12/12/2014 (Approximate)   BMI 20.82 kg/m     General appearance: alert, cooperative and appears stated age   Pelvic: External genitalia:  Vulva with erythema.              Urethra:  normal appearing urethra with no masses, tenderness or lesions              Bartholins and Skenes: normal  Vagina: normal appearing vagina with normal color and discharge, no lesions              Cervix: no lesions                Bimanual Exam:  Uterus:  normal size, contour, position, consistency, mobility, non-tender              Adnexa: no mass, fullness, tenderness          Chaperone was present for exam.  ASSESSMENT   Vulvovaginitis.  Chronic.   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  Rash with Paxil in past.   PLAN  Nuswab. I discussed Tamoxifen and vaginal discharge. We discussed vulvodynia.   Diflucan course. May consider Tindamax if has BV Not a candidate for Cymbalta.  Will start Gabapentin.  Fu in 6 weeks.   An After Visit Summary was printed and given to the patient.  __40____ minutes face to face time of which over 50% was spent in counseling.

## 2019-04-23 NOTE — Patient Instructions (Signed)
I am questioning if you are developing vulvodynia, which means a painful vulva.   You may want to do some reading about this.   I have had a patient recommend a book called Pelvic Pain Explained.  This may or may not pertain to you.

## 2019-04-24 ENCOUNTER — Encounter: Payer: Self-pay | Admitting: Obstetrics and Gynecology

## 2019-04-24 ENCOUNTER — Telehealth: Payer: Self-pay | Admitting: Obstetrics and Gynecology

## 2019-04-24 NOTE — Telephone Encounter (Signed)
Patient sent the following via [facility].  Dr. Quincy Simmonds,    Thank you for your extended visit with me yesterday. I appreciate very much your knowledge, skills, answering all my questions, and giving me hope.     It is a very embarrassing situation to talk about and is made more distressing knowing it will all be available for anyone and everyone to read. I know most won't but still doesn't give me any solace about the lack of privacy I feel with the EMR. I wish there were an option to opt-out.    But I wanted to thank you (and all in the office) for *always* being so very kind, compassionate, understanding, and trustworthy.     (-:    Tonya Yang

## 2019-04-25 NOTE — Telephone Encounter (Signed)
Please inform patient how to add a layer of confidentiality to her EMR by adding the option to have to "[employer]."

## 2019-05-07 LAB — NUSWAB VAGINITIS (VG)
Candida albicans, NAA: NEGATIVE
Candida glabrata, NAA: NEGATIVE
Trich vag by NAA: NEGATIVE

## 2019-05-08 ENCOUNTER — Encounter: Payer: Self-pay | Admitting: Obstetrics and Gynecology

## 2019-05-08 ENCOUNTER — Telehealth: Payer: Self-pay | Admitting: Obstetrics and Gynecology

## 2019-05-08 NOTE — Telephone Encounter (Signed)
Routing to Dr. Silva FYI.   Encounter closed.  

## 2019-05-08 NOTE — Telephone Encounter (Signed)
Patient sent the following correspondence through [facility]. Routing to triage to assist patient with request.  Hi, Dr. Quincy Simmonds!    No problem on wait of results and very happy to know the BV is finally gone!! The symptoms must be from the tamoxifen. I will talk to my oncologist about it.     Thanks so much!    Tonya Yang

## 2019-05-15 ENCOUNTER — Other Ambulatory Visit: Payer: Self-pay | Admitting: Obstetrics and Gynecology

## 2019-05-15 NOTE — Telephone Encounter (Signed)
Request from pharmacy denied. Refill sent to pharmacy 04/23/19 #90 w/2 refills.

## 2019-05-23 ENCOUNTER — Other Ambulatory Visit: Payer: Self-pay

## 2019-05-27 ENCOUNTER — Other Ambulatory Visit: Payer: Self-pay

## 2019-05-27 ENCOUNTER — Encounter: Payer: Self-pay | Admitting: Obstetrics and Gynecology

## 2019-05-27 ENCOUNTER — Ambulatory Visit: Payer: Commercial Managed Care - PPO | Admitting: Obstetrics and Gynecology

## 2019-05-27 ENCOUNTER — Encounter

## 2019-05-27 VITALS — BP 116/62 | HR 80 | Temp 97.7°F | Resp 12 | Ht 61.5 in | Wt 113.0 lb

## 2019-05-27 DIAGNOSIS — N760 Acute vaginitis: Secondary | ICD-10-CM | POA: Diagnosis not present

## 2019-05-27 DIAGNOSIS — M8580 Other specified disorders of bone density and structure, unspecified site: Secondary | ICD-10-CM

## 2019-05-27 DIAGNOSIS — F419 Anxiety disorder, unspecified: Secondary | ICD-10-CM | POA: Diagnosis not present

## 2019-05-27 NOTE — Progress Notes (Signed)
GYNECOLOGY  VISIT   HPI: 58 y.o.   Married  Caucasian  female   G0P0000 with Patient's last menstrual period was 12/12/2014 (approximate).   here for med recheck of vulvovaginitis and possible vulvodynia.   Hx recurrent BV.  Her last testing was negative on 04/23/19. She feels relief that the BV is gone.   I started her on Gabapentin at her last office visit but she stopped this as her vulvar irritation cleared one week after her last visit.   She saw someone from her oncology team about side effects of Tamoxifen of discharge. She was very distressed at the time and wanted help for her anxiety.  She did not receive this assistance. She stopped Tamoxifen one week ago and her emotional distress is gone now.  Her discharge in 98% better. Her oncologist is aware and would like her to see her in 6 months.   She would like to see a counselor but is waiting due to the pandemic. She is now not having daily distress or ruminating about her worries.  She states someone ordered Prolia for her.  She is confused about why, as she has been on Tamoxifen long term and did Letrozole for only 2 months.   She developed a rash on her back from taking Paxil. No mouth swelling, shortness of breath.  Wondering if she should take an SSRI or SNRI.   GYNECOLOGIC HISTORY: Patient's last menstrual period was 12/12/2014 (approximate). Contraception:  Postmenopausal Menopausal hormone therapy:  none Last mammogram:  12-20-18 Bil.Diag.--S/P left lumpectomy for Br.CA;Rt.Br.Neg Last pap smear:   01/21/19 Negative        OB History    Gravida  0   Para  0   Term  0   Preterm  0   AB  0   Living  0     SAB  0   TAB  0   Ectopic  0   Multiple  0   Live Births  0              Patient Active Problem List   Diagnosis Date Noted  . Postmenopausal bleeding 09/08/2018  . Recurrent vaginitis 09/08/2018  . H/O abnormal cervical Papanicolaou smear 06/20/2018  . Acquired trigger finger of right  ring finger 03/22/2018  . Pain in joint of right shoulder 03/22/2018  . Hypercholesterolemia 07/20/2017  . 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 Malignant neoplasm of overlapping sites of left breast in female, estrogen receptor positive ([place]) 01/09/2017  . Dysplastic nevus 06/27/2016  . Insomnia 01/08/2014  . Anxiety 01/08/2014  . Rhytides 11/26/2013  . Alopecia 07/11/2013  . Allergic rhinitis 10/20/2011  . Vitamin D deficiency 06/07/2011  . Malignant melanoma of skin ([place]) 03/28/2011  . Benign neoplasm of skin 02/07/2011    Past Medical History:  Diagnosis Date  . Abnormal Pap smear of cervix 1990   Posible cryotherapy.  . Cancer ([place]) 03/02/2017   left breast cancer  . Fibroids   . HSV-1 infection   . 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 Osteopenia     Past Surgical History:  Procedure Laterality Date  . BREAST SURGERY  03/02/2017   left lumpectomy--[facility]  . MELANOMA EXCISION     left tricep  . TRIGGER FINGER RELEASE Right 04/25/2018   right ring finger    Current Outpatient Medications  Medication Sig Dispense Refill  . atorvastatin (LIPITOR) 10 MG tablet Take by mouth.    . diclofenac (  VOLTAREN) 75 MG EC tablet Take by mouth.    . Diclofenac Sodium (PENNSAID) 2 % SOLN as needed.    . gabapentin (NEURONTIN) 100 MG capsule Take 1 capsule (100 mg total) by mouth 3 (three) times daily. 90 capsule 2  . Multiple Vitamins-Minerals (VITAMIN D3 COMPLETE PO) Take by mouth.     No current facility-administered medications for this visit.      ALLERGIES: Benzocaine, Epinephrine, Paba derivatives, Lanolin, Nickel, Prilocaine, Procaine, and Tetracaine  Family History  Problem Relation Age of Onset  . Breast cancer Mother        DCIS  . Hypertension Mother   . Diabetes Mother        borderline  . Autoimmune disease Mother        giant cell arteritis  . Thyroid disease Mother        hypothyrodism  . Hypertension Father   . Hypertension Sister   .  Thyroid disease Sister        hypothyroidism  . Hypertension Brother     Social History   Socioeconomic History  . Marital status: Married    Spouse name: Not on file  . Number of children: Not on file  . Years of education: Not on file  . Highest education level: Not on file  Occupational History  . Not on file  Social Needs  . Financial resource strain: Not on file  . Food insecurity    Worry: Not on file    Inability: Not on file  . Transportation needs    Medical: Not on file    Non-medical: Not on file  Tobacco Use  . Smoking status: Never Smoker  . Smokeless tobacco: Never Used  Substance and Sexual Activity  . Alcohol use: No  . Drug use: No  . Sexual activity: Not Currently    Birth control/protection: Abstinence, Post-menopausal  Lifestyle  . Physical activity    Days per week: Not on file    Minutes per session: Not on file  . Stress: Not on file  Relationships  . Social Herbalist on phone: Not on file    Gets together: Not on file    Attends religious service: Not on file    Active member of club or organization: Not on file    Attends meetings of clubs or organizations: Not on file    Relationship status: Not on file  . Intimate partner violence    Fear of current or ex partner: Not on file    Emotionally abused: Not on file    Physically abused: Not on file    Forced sexual activity: Not on file  Other Topics Concern  . Not on file  Social History Narrative  . Not on file    Review of Systems  Constitutional: Negative.   HENT: Negative.   Eyes: Negative.   Respiratory: Negative.   Cardiovascular: Negative.   Gastrointestinal: Negative.   Endocrine: Negative.   Genitourinary: Negative.   Musculoskeletal: Negative.   Skin: Negative.   Allergic/Immunologic: Negative.   Neurological: Negative.   Hematological: Negative.   Psychiatric/Behavioral: Negative.     PHYSICAL EXAMINATION:    BP 116/62 (BP Location: Right Arm,  Patient Position: Sitting, Cuff Size: Normal)   Pulse 80   Temp 97.7 F (36.5 C) (Temporal)   Resp 12   Ht 5' 1.5" (1.562 m)   Wt 113 lb (51.3 kg)   LMP 12/12/2014 (Approximate)   BMI 21.01 kg/m  General appearance: alert, cooperative and appears stated age  ASSESSMENT  Anxiety. Chronic vulvovaginitis.  Hx osteopenia.  Off Tamoxifen and feeling improved overall.  PLAN  I gave her information on counselors.  She will do some research and select a counselor. She will call back if she would like to consider medication.  We will monitor her vaginal pain symptoms which hopefully will improve now that she is off Tamoxifen and her BV is gone.  BMD in 1 -2 years.  Continue weight bearing exercise, calcium, and vit D.  FU prn.    An After Visit Summary was printed and given to the patient.  __25____ minutes face to face time of which over 50% was spent in counseling.

## 2019-06-05 ENCOUNTER — Ambulatory Visit: Payer: Commercial Managed Care - PPO | Admitting: Obstetrics and Gynecology

## 2019-06-15 ENCOUNTER — Other Ambulatory Visit: Payer: Self-pay | Admitting: Obstetrics and Gynecology

## 2019-07-11 ENCOUNTER — Encounter: Payer: Self-pay | Admitting: Obstetrics and Gynecology

## 2019-07-12 ENCOUNTER — Telehealth: Payer: Self-pay | Admitting: Obstetrics and Gynecology

## 2019-07-12 DIAGNOSIS — M8580 Other specified disorders of bone density and structure, unspecified site: Secondary | ICD-10-CM

## 2019-07-12 DIAGNOSIS — Z780 Asymptomatic menopausal state: Secondary | ICD-10-CM

## 2019-07-12 NOTE — Telephone Encounter (Signed)
Per review of 05/27/2019 OV notes, last BMD 07/26/17, Hx of osteopenia. Repeat BMD 1-52yrs.   Call placed to patient, last BMD was at [facility], patient is requesting to schedule BMD in [facility], request order to [facility]. Advised I will forward order to Dr. Quincy Simmonds to review, advised she may contact [facility] directly to schedule BMD. Patient verbalizes understanding.  Order pended for BMD Routing to Dr. Quincy Simmonds

## 2019-07-12 NOTE — Telephone Encounter (Signed)
Patient sent the following correspondence through [facility]. Routing to triage to assist patient with request.  Hi, Dr. Quincy Simmonds!    I last had bone density in August of 2018. My understanding is that testing can be done every two years and was wondering if we should schedule a scan for next month?    Thanks!    Tonya Yang

## 2019-07-13 NOTE — Telephone Encounter (Signed)
OK for BMD at the [facility].  Diagnosis:  Menopause, osteopenia.

## 2019-07-15 NOTE — Telephone Encounter (Signed)
Order placed for BMD at [facility]. Patient notified. Patient states she may check with [facility] to she when the first available BMD is, may return there. Advised patient if desire to have BMD done at [facility] return call to office to notify so that order can be faxed. Patient thankful for return call and verbalizes understanding.   Routing to provider for final review. Patient is agreeable to disposition. Will close encounter.

## 2019-07-16 ENCOUNTER — Other Ambulatory Visit: Payer: Self-pay

## 2019-07-16 ENCOUNTER — Ambulatory Visit
Admission: RE | Admit: 2019-07-16 | Discharge: 2019-07-16 | Disposition: A | Discharge status: Home / Self Care | Payer: Commercial Managed Care - PPO | Source: Ambulatory Visit | Attending: Obstetrics and Gynecology | Admitting: Obstetrics and Gynecology

## 2019-07-16 DIAGNOSIS — M8580 Other specified disorders of bone density and structure, unspecified site: Secondary | ICD-10-CM

## 2019-07-16 DIAGNOSIS — Z780 Asymptomatic menopausal state: Secondary | ICD-10-CM

## 2019-07-17 ENCOUNTER — Telehealth: Payer: Self-pay | Admitting: Obstetrics and Gynecology

## 2019-07-17 NOTE — Telephone Encounter (Signed)
Patient would like to come in and discuss bone density results.

## 2019-07-17 NOTE — Telephone Encounter (Signed)
Call to patient. Patient requesting to come in and discuss BMD results with Dr. Quincy Simmonds. Patient requesting to schedule the last week of August. Patient scheduled for 08-08-2019 at 1030. Patient agreeable to date and time of appointment.   Routing to provider and will close encounter.

## 2019-07-17 NOTE — Telephone Encounter (Signed)
Encounter reviewed and closed.  

## 2019-08-06 ENCOUNTER — Other Ambulatory Visit: Payer: Self-pay

## 2019-08-07 NOTE — Progress Notes (Signed)
GYNECOLOGY  VISIT   HPI: 58 y.o.   Married  Caucasian  female   G0P0000 with Patient's last menstrual period was 12/12/2014 (approximate).   here to discuss BMD results.   BMD done 07/16/19 shows osteopenia.  T sore -1.5 of the hip.  Her last BMD was at [facility], and she had osteopenia there.   Very physically active, so she is perplexed why she has osteopenia.   Takes vit D 3 1000 IU daily.   She feels much better off Tamoxifen.  She has decreased anxiety now.  She does feel that anxiety is still present without a reason.  Has an Rx from her PCP for Klonopin, which makes her sleep.   She did not start Cymbalta in the past due to her tamoxifen medication.  She took Paxil and she had rash on her back. Effexor was too strong for her.  Wellbutrin was used in the past 4 years ago, and this was maybe not helpful.   GYNECOLOGIC HISTORY: Patient's last menstrual period was 12/12/2014 (approximate). Contraception: Postmenopausal Menopausal hormone therapy:  none Last mammogram: 12-20-18 Bil.Diag.--S/P left lumpectomy for Br.CA;Rt.Br.Neg Last pap smear: 01/21/19 Negative        OB History    Gravida  0   Para  0   Term  0   Preterm  0   AB  0   Living  0     SAB  0   TAB  0   Ectopic  0   Multiple  0   Live Births  0              Patient Active Problem List   Diagnosis Date Noted  . Postmenopausal bleeding 09/08/2018  . Recurrent vaginitis 09/08/2018  . H/O abnormal cervical Papanicolaou smear 06/20/2018  . Acquired trigger finger of right ring finger 03/22/2018  . Pain in joint of right shoulder 03/22/2018  . Hypercholesterolemia 07/20/2017  . 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 Malignant neoplasm of overlapping sites of left breast in female, estrogen receptor positive ([place]) 01/09/2017  . Dysplastic nevus 06/27/2016  . Insomnia 01/08/2014  . Anxiety 01/08/2014  . Rhytides 11/26/2013  . Alopecia 07/11/2013  . Allergic rhinitis 10/20/2011   . Vitamin D deficiency 06/07/2011  . Malignant melanoma of skin ([place]) 03/28/2011  . Benign neoplasm of skin 02/07/2011    Past Medical History:  Diagnosis Date  . Abnormal Pap smear of cervix 1990   Posible cryotherapy.  . Cancer ([place]) 03/02/2017   left breast cancer  . Fibroids   . HSV-1 infection   . Melanoma ([place])    left tricep  . Osteopenia     Past Surgical History:  Procedure Laterality Date  . BREAST SURGERY  03/02/2017   left lumpectomy--[facility]  . MELANOMA EXCISION     left tricep  . TRIGGER FINGER RELEASE Right 04/25/2018   right ring finger    Current Outpatient Medications  Medication Sig Dispense Refill  . atorvastatin (LIPITOR) 10 MG tablet Take 10 mg by mouth 2 (two) times a week.     . Cholecalciferol (VITAMIN D3 PO) Take 1 capsule by mouth daily.    . diclofenac (VOLTAREN) 75 MG EC tablet Take by mouth.    . Diclofenac Sodium (PENNSAID) 2 % SOLN as needed.    . ezetimibe (ZETIA) 10 MG tablet Take 1 tablet by mouth daily.    Marland Kitchen ketoconazole (NIZORAL) 2 % shampoo Apply topically.    . Melatonin 3 MG  CAPS Take 1 capsule by mouth at bedtime.     No current facility-administered medications for this visit.      ALLERGIES: Benzocaine, Epinephrine, Paba derivatives, Lanolin, Nickel, Prilocaine, Procaine, and Tetracaine  Family History  Problem Relation Age of Onset  . Breast cancer Mother        DCIS  . Hypertension Mother   . Diabetes Mother        borderline  . Autoimmune disease Mother        giant cell arteritis  . Thyroid disease Mother        hypothyrodism  . Hypertension Father   . Hypertension Sister   . Thyroid disease Sister        hypothyroidism  . Hypertension Brother     Social History   Socioeconomic History  . Marital status: Married    Spouse name: Not on file  . Number of children: Not on file  . Years of education: Not on file  . Highest education level: Not on file  Occupational History  . Not on file  Social Needs  .  Financial resource strain: Not on file  . Food insecurity    Worry: Not on file    Inability: Not on file  . Transportation needs    Medical: Not on file    Non-medical: Not on file  Tobacco Use  . Smoking status: Never Smoker  . Smokeless tobacco: Never Used  Substance and Sexual Activity  . Alcohol use: No  . Drug use: No  . Sexual activity: Not Currently    Birth control/protection: Abstinence, Post-menopausal  Lifestyle  . Physical activity    Days per week: Not on file    Minutes per session: Not on file  . Stress: Not on file  Relationships  . Social Herbalist on phone: Not on file    Gets together: Not on file    Attends religious service: Not on file    Active member of club or organization: Not on file    Attends meetings of clubs or organizations: Not on file    Relationship status: Not on file  . Intimate partner violence    Fear of current or ex partner: Not on file    Emotionally abused: Not on file    Physically abused: Not on file    Forced sexual activity: Not on file  Other Topics Concern  . Not on file  Social History Narrative  . Not on file    Review of Systems  PHYSICAL EXAMINATION:    BP 122/78   Pulse 76   Temp 97.6 F (36.4 C) (Temporal)   Ht 5' 1.5" (1.562 m)   Wt 107 lb 12.8 oz (48.9 kg)   LMP 12/12/2014 (Approximate)   BMI 20.04 kg/m     General appearance: alert, cooperative and appears stated age   ASSESSMENT  Osteopenia.   Episodic anxiety.   PLAN  We discussed osteopenia.  We discussed weight bearing exercise, calcium and vit D through diet or supplements.  Check Vit D and BMP.  BMD in 2 years. We discussed Cymbalta and potential side effects.  This was not prescribed today. Reduce Klonopin dosage (she can split the pill) and FU with PCP RE anxiety.  Fu for annual exam and prn.    An After Visit Summary was printed and given to the patient.  __25____ minutes face to face time of which over 50% was spent  in counseling.

## 2019-08-08 ENCOUNTER — Ambulatory Visit: Payer: Commercial Managed Care - PPO | Admitting: Obstetrics and Gynecology

## 2019-08-08 ENCOUNTER — Encounter: Payer: Self-pay | Admitting: Obstetrics and Gynecology

## 2019-08-08 ENCOUNTER — Other Ambulatory Visit: Payer: Self-pay

## 2019-08-08 VITALS — BP 122/78 | HR 76 | Temp 97.6°F | Ht 61.5 in | Wt 107.8 lb

## 2019-08-08 DIAGNOSIS — M8580 Other specified disorders of bone density and structure, unspecified site: Secondary | ICD-10-CM | POA: Diagnosis not present

## 2019-08-08 DIAGNOSIS — F419 Anxiety disorder, unspecified: Secondary | ICD-10-CM | POA: Diagnosis not present

## 2019-08-09 LAB — BASIC METABOLIC PANEL
BUN/Creatinine Ratio: 20 (ref 9–23)
BUN: 15 mg/dL (ref 6–24)
CO2: 24 mmol/L (ref 20–29)
Calcium: 9.6 mg/dL (ref 8.7–10.2)
Chloride: 101 mmol/L (ref 96–106)
Creatinine, Ser: 0.74 mg/dL (ref 0.57–1.00)
GFR calc Af Amer: 105 mL/min/{1.73_m2} (ref >59)
GFR calc non Af Amer: 91 mL/min/{1.73_m2} (ref >59)
Glucose: 86 mg/dL (ref 65–99)
Potassium: 4.4 mmol/L (ref 3.5–5.2)
Sodium: 142 mmol/L (ref 134–144)

## 2019-08-09 LAB — VITAMIN D 25 HYDROXY (VIT D DEFICIENCY, FRACTURES): Vit D, 25-Hydroxy: 51.2 ng/mL (ref 30.0–100.0)

## 2019-08-12 ENCOUNTER — Telehealth: Payer: Self-pay | Admitting: Obstetrics and Gynecology

## 2019-08-12 ENCOUNTER — Encounter: Payer: Self-pay | Admitting: Obstetrics and Gynecology

## 2019-08-12 NOTE — Telephone Encounter (Signed)
Call to patient. Advised of message as seen below from Dr. Quincy Simmonds and patient verbalized understanding. Appreciative of phone call.   Will close encounter.

## 2019-08-12 NOTE — Telephone Encounter (Signed)
I recommend she continue with the vit D3 1000 IU daily.  She does not need more than this.

## 2019-08-12 NOTE — Telephone Encounter (Signed)
08/08/19 Vitamin D: 51.2  Dr. Quincy Simmonds -please advise on OTC Vit D

## 2019-08-12 NOTE — Telephone Encounter (Signed)
Patient sent the following correspondence through [facility].    Hi, Dr. Quincy Simmonds!  Thanks for the test results (and for your exceptional care)!  Do you recommend that I continue the 1000iu of Vitamin D, or is it not necessary?   Tonya Yang

## 2019-11-19 NOTE — Progress Notes (Signed)
58 y.o. G63P0000 Married Caucasian female here for annual exam.    No vaginal bleeding or spotting.  Avoiding irritants on her bottom, and she is feeling better now.   Has had weight gain.  Not exercising as much.   Patient reporting pain in left breast--same breast she had breast cancer.--See update mammogram in Care Everywhere. She was seen for this at [facility] in October, 2020.  She states it is painful to the touch.  States it is "out of this world pain."  States her blood pressure is normal when she checks it at home.  She is a heavy dairy eater.   Husband has retired.  PCP:  Gaetano Hawthorne, MD   Patient's last menstrual period was 12/12/2014 (approximate).           Sexually active: No.  The current method of family planning is post menopausal status.    Exercising: No.  The patient does not participate in regular exercise at present. Smoker:  no  Health Maintenance: Pap: 01-21-19 ASCUS:Neg HR HPV, 11-17-17 Neg:Neg HR HPV, 11-12-14 Neg History of abnormal Pap:  yes MMG:  09-27-19 Diag.& Lt.Br.u/s/Neg/BiRads2--[facility] Colonoscopy: 05/2017 adenoma polyp;next due 05/2020 BMD:  07-16-19  Result :Osteopenia of hip TDaP: 11-19-18 Gardasil:   no HIV:03-27-17 NR Hep C:03-27-17 Neg Screening Labs:  Dr. Monico Hoar and Dr. Arther Dames at [facility]. Flu vaccine:  Completed in October, 2020.   reports that she has never smoked. She has never used smokeless tobacco. She reports that she does not drink alcohol or use drugs.  Past Medical History:  Diagnosis Date  . Abnormal Pap smear of cervix 1990   Posible cryotherapy.  . Cancer ([place]) 03/02/2017   left breast cancer  . Fibroids   . HSV-1 infection   . 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 Osteopenia     Past Surgical History:  Procedure Laterality Date  . BREAST SURGERY  03/02/2017   left lumpectomy--[facility]  . MELANOMA EXCISION     left tricep  . TRIGGER FINGER RELEASE Right 04/25/2018   right ring finger    Current Outpatient Medications   Medication Sig Dispense Refill  . atorvastatin (LIPITOR) 10 MG tablet Take 10 mg by mouth 2 (two) times a week.     . Cholecalciferol (VITAMIN D3 PO) Take 1 capsule by mouth daily.    . diclofenac (VOLTAREN) 75 MG EC tablet Take by mouth.    . Diclofenac Sodium (PENNSAID) 2 % SOLN Apply 1 application topically as needed.    . ezetimibe (ZETIA) 10 MG tablet Take 1 tablet by mouth daily.    Marland Kitchen ketoconazole (NIZORAL) 2 % shampoo Apply topically.    . Melatonin 3 MG CAPS Take 1 capsule by mouth at bedtime.    . Meloxicam 15 MG TBDP Take 1 tablet by mouth daily.     No current facility-administered medications for this visit.    Family History  Problem Relation Age of Onset  . Breast cancer Mother        DCIS  . Hypertension Mother   . Diabetes Mother        borderline  . Autoimmune disease Mother        giant cell arteritis  . Thyroid disease Mother        hypothyrodism  . Hypertension Father   . Hypertension Sister   . Thyroid disease Sister        hypothyroidism  . Hypertension Brother     Review of Systems  All other  systems reviewed and are negative.   Exam:   BP (!) 158/86   Pulse 80   Temp 97.7 F (36.5 C) (Temporal)   Resp 16   Ht 5\' 1"  (1.549 m)   Wt 110 lb 9.6 oz (50.2 kg)   LMP 12/12/2014 (Approximate)   BMI 20.90 kg/m     General appearance: alert, cooperative and appears stated age Head: normocephalic, without obvious abnormality, atraumatic Neck: no adenopathy, supple, symmetrical, trachea midline and thyroid normal to inspection and palpation Lungs: clear to auscultation bilaterally Breasts: normal appearance, no masses. Bilateral breast pain.  No nipple retraction or dimpling, No nipple discharge or bleeding, No axillary adenopathy Heart: regular rate and rhythm Abdomen: soft, non-tender; no masses, no organomegaly Extremities: extremities normal, atraumatic, no cyanosis or edema Skin: skin color, texture, turgor normal. No rashes or lesions Lymph  nodes: cervical, supraclavicular, and axillary nodes normal. Neurologic: grossly normal  Pelvic: External genitalia:  no lesions              No abnormal inguinal nodes palpated.              Urethra:  normal appearing urethra with no masses, tenderness or lesions              Bartholins and Skenes: normal                 Vagina: normal appearing vagina with normal color and discharge, no lesions.  Speculum exam painful.              Cervix: no lesions              Pap taken: No. Bimanual Exam:  Uterus:  normal size, contour, position, consistency, mobility.  Bimanual exam generally painful.  No CMT.              Adnexa: no mass, fullness, tenderness              Rectal exam: Yes.  .  Confirms.              Anus:  normal sphincter tone, no lesions  Chaperone was present for exam.  Assessment:   Well woman visit with normal exam. ASCUS pap with negative HR HPV.  Osteopenia. Followed at [facility].  Left breast cancer.Status post lumpectomy and XRT.OffTamoxifen.  Bilateral breast pain, left greater than right.   Has been seen at [facility] for this.  Fibroids.  Atrophy.  Pain with examination.  Hx HSV I.  Hx melanoma. Hx elevated BP reading today.   Plan: Mammogram screening at [facility].  She will contact her physician provider at [facility] for further evaluation and treatment of her pain.  We discussed breast MRI may be indicated.  We also reviewed potential pain modulating medication for her treatment.  Pap and HR HPV in 2023.  Guidelines for Calcium, Vitamin D, regular exercise program including cardiovascular and weight bearing exercise. Cooking oils and vaginal vitamin E suppositories reviewed. Routine labs today. BMD in 2022.  She will check her BP at home and follow up with her PCP.  Follow up annually and prn.   After visit summary provided.

## 2019-11-20 ENCOUNTER — Other Ambulatory Visit: Payer: Self-pay

## 2019-11-21 ENCOUNTER — Encounter: Payer: Self-pay | Admitting: Obstetrics and Gynecology

## 2019-11-21 ENCOUNTER — Ambulatory Visit (INDEPENDENT_AMBULATORY_CARE_PROVIDER_SITE_OTHER): Payer: Commercial Managed Care - PPO | Admitting: Obstetrics and Gynecology

## 2019-11-21 VITALS — BP 158/86 | HR 80 | Temp 97.7°F | Resp 16 | Ht 61.0 in | Wt 110.6 lb

## 2019-11-21 DIAGNOSIS — Z01419 Encounter for gynecological examination (general) (routine) without abnormal findings: Secondary | ICD-10-CM

## 2019-11-21 NOTE — Patient Instructions (Signed)

## 2019-11-22 ENCOUNTER — Telehealth: Payer: Self-pay | Admitting: Obstetrics and Gynecology

## 2019-11-22 LAB — COMPREHENSIVE METABOLIC PANEL
ALT: 20 IU/L (ref 0–32)
AST: 23 IU/L (ref 0–40)
Albumin/Globulin Ratio: 2.1 (ref 1.2–2.2)
Albumin: 4.8 g/dL (ref 3.8–4.9)
Alkaline Phosphatase: 76 IU/L (ref 39–117)
BUN/Creatinine Ratio: 20 (ref 9–23)
BUN: 15 mg/dL (ref 6–24)
Bilirubin Total: 0.5 mg/dL (ref 0.0–1.2)
CO2: 27 mmol/L (ref 20–29)
Calcium: 9.3 mg/dL (ref 8.7–10.2)
Chloride: 100 mmol/L (ref 96–106)
Creatinine, Ser: 0.74 mg/dL (ref 0.57–1.00)
GFR calc Af Amer: 105 mL/min/{1.73_m2} (ref >59)
GFR calc non Af Amer: 91 mL/min/{1.73_m2} (ref >59)
Globulin, Total: 2.3 g/dL (ref 1.5–4.5)
Glucose: 94 mg/dL (ref 65–99)
Potassium: 4 mmol/L (ref 3.5–5.2)
Sodium: 139 mmol/L (ref 134–144)
Total Protein: 7.1 g/dL (ref 6.0–8.5)

## 2019-11-22 LAB — CBC
Hematocrit: 44.4 % (ref 34.0–46.6)
Hemoglobin: 14.8 g/dL (ref 11.1–15.9)
MCH: 30.1 pg (ref 26.6–33.0)
MCHC: 33.3 g/dL (ref 31.5–35.7)
MCV: 90 fL (ref 79–97)
Platelets: 243 10*3/uL (ref 150–450)
RBC: 4.92 x10E6/uL (ref 3.77–5.28)
RDW: 13.1 % (ref 11.7–15.4)
WBC: 5.9 10*3/uL (ref 3.4–10.8)

## 2019-11-22 LAB — TSH: TSH: 1.39 u[IU]/mL (ref 0.450–4.500)

## 2019-11-22 LAB — VITAMIN D 25 HYDROXY (VIT D DEFICIENCY, FRACTURES): Vit D, 25-Hydroxy: 29.8 ng/mL — ABNORMAL LOW (ref 30.0–100.0)

## 2019-11-22 NOTE — Telephone Encounter (Signed)
Please place in pap recall for 2023.  (36 months) Patient has a hx of ASCUS pap and negative HR HPV for 2020.

## 2019-11-26 NOTE — Telephone Encounter (Signed)
36 recall entered for 11/2022.

## 2020-01-14 ENCOUNTER — Telehealth: Payer: Self-pay | Admitting: Obstetrics and Gynecology

## 2020-01-14 ENCOUNTER — Encounter: Payer: Self-pay | Admitting: Obstetrics and Gynecology

## 2020-01-14 NOTE — Telephone Encounter (Signed)
Patient sent the following message through [place]. Routing to triage to assist patient with request.  Lindsey-Danner, Hinton Dyer "Adrene Mann"  [facility]  Phone Number: [phone]  Hi,   I recently looked at my latest Vitamin D test result and understand that it's just below normal, but compared to the results in August it's 30 points lower! Any idea what would cause such a big and sudden drop? I'd been taking supplements and have always eaten several foods sourced with Vitamin D; are there any possible reasons--besides old age? (-:   Thx!   Hinton Dyer

## 2020-01-15 NOTE — Telephone Encounter (Signed)
Spoke with patient. Vit D on 08/08/19 was 51.2 and 29.8 on 11/21/19. Patient reports no change in diet, eats Vit D rich foods and takes 1,000 IU of Vitamin D daily. Patient is aware Vit D can drop in winter months. Patient is asking if any concerns with change in Vit D? Any additional recommendations?   Routing to Dr. Quincy Simmonds to review and advise.

## 2020-01-15 NOTE — Telephone Encounter (Signed)
I agree that the drop likely reflects a change due to the winter months and less exposure to sunlight.  I would recommend she increase her intake of vit D to 2000 IU daily during the winter months when she is outside less.

## 2020-01-15 NOTE — Telephone Encounter (Signed)
Spoke with patient, advised per Dr. Silva. Patient verbalizes understanding and is agreeable.  Encounter closed.  

## 2020-11-23 ENCOUNTER — Ambulatory Visit: Payer: Commercial Managed Care - PPO | Admitting: Obstetrics and Gynecology

## 2020-11-23 ENCOUNTER — Encounter: Payer: Self-pay | Admitting: Obstetrics and Gynecology

## 2020-11-23 ENCOUNTER — Other Ambulatory Visit: Payer: Self-pay

## 2020-11-23 VITALS — BP 166/88 | HR 83 | Ht 61.0 in | Wt 111.4 lb

## 2020-11-23 DIAGNOSIS — Z780 Asymptomatic menopausal state: Secondary | ICD-10-CM

## 2020-11-23 DIAGNOSIS — Z01419 Encounter for gynecological examination (general) (routine) without abnormal findings: Secondary | ICD-10-CM | POA: Diagnosis not present

## 2020-11-23 NOTE — Progress Notes (Signed)
59 y.o. G41P0000 Married Caucasian female here for annual exam.    Last year saw a physical therapist for left breast pain. Periodic anal itching.  Comes and goes.   Helping her mother who has health issues.  Received her Covid vaccines and flu vaccine.   PCP:   Gaetano Hawthorne, MD  Patient's last menstrual period was 12/12/2014 (approximate).           Sexually active: No.  The current method of family planning is post menopausal status.    Exercising: No.  The patient does not participate in regular exercise at present.not when it's cold. Smoker:  no  Health Maintenance: Pap: 01-21-19 ASCUS:Neg HR HPV,11-17-17 Neg:Neg HR HPV, 11-12-14 Neg History of abnormal Pap:  Yes, 1990 hx possible cryotherapy MMG: 09-27-19 Bil.Diag./Neg/BiRads2/rec.Diag.in 12 mo/Care Everywhere---Has appt. 12/2020 at [facility].  Colonoscopy:  05/2017 adenoma polyp;next due 05/2022. BMD: 07-16-19  Result :Osteopenia of hip TDaP: 11-19-18 Gardasil:   no HIV:03-27-17 NR Hep C: 03-27-18 Neg Screening Labs:  PCP.    reports that she has never smoked. She has never used smokeless tobacco. She reports that she does not drink alcohol and does not use drugs.  Past Medical History:  Diagnosis Date  . Abnormal Pap smear of cervix 1990   Posible cryotherapy.  . Cancer ([place]) 03/02/2017   left breast cancer  . Fibroids   . HSV-1 infection   . 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 Osteopenia     Past Surgical History:  Procedure Laterality Date  . BREAST SURGERY  03/02/2017   left lumpectomy--[facility]  . MELANOMA EXCISION     left tricep  . TRIGGER FINGER RELEASE Right 04/25/2018   right ring finger    Current Outpatient Medications  Medication Sig Dispense Refill  . Cholecalciferol (VITAMIN D3 PO) Take 1 capsule by mouth daily.    . diclofenac (VOLTAREN) 75 MG EC tablet Take by mouth.    . Diclofenac Sodium (PENNSAID) 2 % SOLN Apply 1 application topically as needed.    Marland Kitchen ketoconazole (NIZORAL) 2 % shampoo Apply topically.     . Melatonin 3 MG CAPS Take 1 capsule by mouth at bedtime.    . Meloxicam 15 MG TBDP Take 1 tablet by mouth daily.    . rosuvastatin (CRESTOR) 10 MG tablet Take 1 tablet by mouth daily.    Marland Kitchen ezetimibe (ZETIA) 10 MG tablet Take 1 tablet by mouth daily.     No current facility-administered medications for this visit.    Family History  Problem Relation Age of Onset  . Breast cancer Mother        DCIS  . Hypertension Mother   . Diabetes Mother        borderline  . Autoimmune disease Mother        giant cell arteritis  . Thyroid disease Mother        hypothyrodism  . Hypertension Father   . Hypertension Sister   . Thyroid disease Sister        hypothyroidism  . Hypertension Brother     Review of Systems  All other systems reviewed and are negative.   Exam:   BP (!) 166/88   Pulse 83   Ht 5\' 1"  (1.549 m)   Wt 111 lb 6.4 oz (50.5 kg)   LMP 12/12/2014 (Approximate)   SpO2 98%   BMI 21.05 kg/m     General appearance: alert, cooperative and appears stated age Head: normocephalic, without obvious abnormality, atraumatic Neck:  no adenopathy, supple, symmetrical, trachea midline and thyroid normal to inspection and palpation Lungs: clear to auscultation bilaterally Breasts: normal appearance, no masses, has bilateral generalized tenderness, No nipple retraction or dimpling, No nipple discharge or bleeding, No axillary adenopathy Heart: regular rate and rhythm Abdomen: soft, non-tender; no masses, no organomegaly Extremities: extremities normal, atraumatic, no cyanosis or edema Skin: skin color, texture, turgor normal. No rashes or lesions Lymph nodes: cervical, supraclavicular, and axillary nodes normal. Neurologic: grossly normal  Pelvic: External genitalia:  no lesions              No abnormal inguinal nodes palpated.              Urethra:  normal appearing urethra with no masses, tenderness or lesions              Bartholins and Skenes: normal                 Vagina:  normal appearing vagina with normal color and discharge, no lesions              Cervix: no lesions              Pap taken: No. Bimanual Exam:  Uterus:  normal size, contour, position, consistency, mobility.  Generalized tenderness with exam.               Adnexa: no mass, fullness, tenderness              Rectal exam: Yes.  .  Confirms.              Anus:  normal sphincter tone, no lesions  Chaperone was present for exam.  Assessment:   Well woman visit with normal exam. ASCUS pap with negative HR HPV.  Osteopenia.   Menopause. Left breast cancer.Status post lumpectomy and XRT.OffTamoxifen.  Bilateral breast pain, left greater than right.   Has been seen at [facility] for this.  Fibroids. Atrophy.   Tender on exam. Hx HSV I.  Hx melanoma.  Plan: Mammogram screening discussed. Self breast awareness reviewed. Pap and HR HPV 2023.  Guidelines for Calcium, Vitamin D, regular exercise program including cardiovascular and weight bearing exercise. She will return to physical therapy next year and call me if she would like to see a neurologist for chronic pain following her breast cancer care.  BMD in August 2022 at [facility].  Try Aloe wipes, Witch hazel, preparation H.   Avoid constipation.  Follow up annually and prn.   After visit summary provided.

## 2020-11-23 NOTE — Patient Instructions (Signed)

## 2021-07-19 ENCOUNTER — Ambulatory Visit
Admission: RE | Admit: 2021-07-19 | Discharge: 2021-07-19 | Disposition: A | Discharge status: Home / Self Care | Payer: Commercial Managed Care - PPO | Source: Ambulatory Visit | Attending: Obstetrics and Gynecology | Admitting: Obstetrics and Gynecology

## 2021-07-19 ENCOUNTER — Other Ambulatory Visit: Payer: Self-pay

## 2021-07-19 DIAGNOSIS — Z780 Asymptomatic menopausal state: Secondary | ICD-10-CM

## 2021-07-22 ENCOUNTER — Encounter: Payer: Self-pay | Admitting: Obstetrics and Gynecology

## 2021-08-30 ENCOUNTER — Encounter: Payer: Self-pay | Admitting: Obstetrics and Gynecology

## 2021-08-30 ENCOUNTER — Ambulatory Visit: Payer: Commercial Managed Care - PPO | Admitting: Obstetrics and Gynecology

## 2021-08-30 ENCOUNTER — Other Ambulatory Visit: Payer: Self-pay | Admitting: Obstetrics and Gynecology

## 2021-08-30 ENCOUNTER — Ambulatory Visit (INDEPENDENT_AMBULATORY_CARE_PROVIDER_SITE_OTHER): Payer: Commercial Managed Care - PPO | Admitting: Obstetrics and Gynecology

## 2021-08-30 ENCOUNTER — Other Ambulatory Visit: Payer: Self-pay

## 2021-08-30 VITALS — BP 138/82 | HR 92 | Ht 61.0 in | Wt 117.0 lb

## 2021-08-30 DIAGNOSIS — N9089 Other specified noninflammatory disorders of vulva and perineum: Secondary | ICD-10-CM

## 2021-08-30 DIAGNOSIS — Z01419 Encounter for gynecological examination (general) (routine) without abnormal findings: Secondary | ICD-10-CM | POA: Diagnosis not present

## 2021-08-30 NOTE — Patient Instructions (Signed)

## 2021-08-30 NOTE — Progress Notes (Deleted)
60 y.o. G0P0000 Married Caucasian female here for annual exam.    PCP:  Gaetano Hawthorne, MD   Patient's last menstrual period was 12/12/2014 (approximate).           Sexually active: {yes no:314532}  The current method of family planning is post menopausal status.    Exercising: {yes no:314532}  {types:19826} Smoker:  no  Health Maintenance: Pap:  01-21-19 ASCUS:Neg HR HPV, 11-17-17 Neg:Neg HR HPV, 11-12-14 Neg History of abnormal Pap:  yes, 1990 hx possible cryotherapy MMG: 05-18-21 MRI BR/Neg/BiRads 1--care everywhere Colonoscopy:  ***05/2017 adenoma polyp;next due 05/2022. BMD: 07-19-21  Result:osteopenia--slight progression TDaP: 11-19-18 Gardasil:   no HIV: 03-27-17 NR Hep C:03-27-17 Neg Screening Labs:  Hb today: ***, Urine today: ***   reports that she has never smoked. She has never used smokeless tobacco. She reports that she does not drink alcohol and does not use drugs.  Past Medical History:  Diagnosis Date   Abnormal Pap smear of cervix 1990   Posible cryotherapy.   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     diclofenac (VOLTAREN) 75 MG EC tablet Take by mouth.     Diclofenac Sodium (PENNSAID) 2 % SOLN Apply 1 application topically as needed.     ezetimibe (ZETIA) 10 MG tablet Take 1 tablet by mouth daily.     ketoconazole (NIZORAL) 2 % shampoo Apply topically.     Melatonin 3 MG CAPS Take 1 capsule by mouth at bedtime.     Meloxicam 15 MG TBDP Take 1 tablet by mouth daily.     rosuvastatin (CRESTOR) 10 MG tablet Take 1 tablet by mouth daily.     No  current facility-administered medications for this visit.    Family History  Problem Relation Age of Onset   Breast cancer Mother        DCIS   Hypertension Mother    Diabetes Mother        borderline   Autoimmune disease Mother        giant cell arteritis   Thyroid disease Mother        hypothyrodism   Hypertension Father    Hypertension Sister    Thyroid disease Sister        hypothyroidism   Hypertension Brother     Review of Systems  Exam:   LMP 12/12/2014 (Approximate)     General appearance: alert, cooperative and appears stated age Head: normocephalic, without obvious abnormality, atraumatic Neck: no adenopathy, supple, symmetrical, trachea midline and thyroid normal to inspection and palpation Lungs: clear to auscultation bilaterally Breasts: normal appearance, no masses or tenderness, No nipple retraction or dimpling, No nipple discharge or bleeding, No axillary adenopathy Heart: regular rate and rhythm Abdomen: soft, non-tender; no masses, no organomegaly Extremities: extremities normal, atraumatic, no cyanosis or edema Skin: skin color, texture, turgor normal. No rashes or lesions Lymph nodes: cervical, supraclavicular, and axillary nodes normal. Neurologic: grossly normal  Pelvic: External genitalia:  no lesions  No abnormal inguinal nodes palpated.              Urethra:  normal appearing urethra with no masses, tenderness or lesions              Bartholins and Skenes: normal                 Vagina: normal appearing vagina with normal color and discharge, no lesions              Cervix: no lesions              Pap taken: {yes no:314532} Bimanual Exam:  Uterus:  normal size, contour, position, consistency, mobility, non-tender              Adnexa: no mass, fullness, tenderness              Rectal exam: {yes no:314532}.  Confirms.              Anus:  normal sphincter tone, no lesions  Chaperone was present for exam:  ***  Assessment:   Well  woman visit with gynecologic exam.   Plan: Mammogram screening discussed. Self breast awareness reviewed. Pap and HR HPV as above. Guidelines for Calcium, Vitamin D, regular exercise program including cardiovascular and weight bearing exercise.   Follow up annually and prn.   Additional counseling given.  {yes [id number]. _______ minutes face to face time of which over 50% was spent in counseling.    After visit summary provided.

## 2021-08-30 NOTE — Progress Notes (Signed)
60 y.o. Tonya Yang Married Caucasian female here for annual exam.    Noticed a white spot on her vulva last week.  It was itching.  She is worried she has cancer.   No vaginal bleeding or spotting.   Had blood work with her rheumatologist, Dr. Marijean Bravo.  Had elevated eos on WBC per patient.   PCP:  Gaetano Hawthorne, MD  Patient's last menstrual period was 12/12/2014 (approximate).           Sexually active: No.  The current method of family planning is post menopausal status.    Exercising: Yes.     Smoker:  no  Health Maintenance: Pap:  01-21-19 ASCUS:Neg HR HPV, 11-17-17 Neg:Neg HR HPV, 11-12-14 Neg History of abnormal Pap:  yes,  1990 hx possible cryotherapy MMG: 05-18-21 MRI BR/Neg/BiRads 1--care everywhere Colonoscopy: 05/2017 adenoma polyp;next due 05/2022. BMD:  07-19-21   Result :osteopenia--slight progression.  FRAX:  major fracture risk 13% and hip fracture risk 1.6%. TDaP:  11-19-18 Gardasil:   no HIV: 03-27-17 NR Hep C:03-27-17 Neg Screening Labs:  Rheumatology, PCP, and oncology.   reports that she has never smoked. She has never used smokeless tobacco. She reports that she does not drink alcohol and does not use drugs.  Past Medical History:  Diagnosis Date   Abnormal Pap smear of cervix 1990   Posible cryotherapy.   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     celecoxib (CELEBREX) 200 MG capsule Take 200 mg by mouth daily as needed.     Cholecalciferol (VITAMIN D3 PO) Take 1 capsule by mouth daily.     Diclofenac Sodium  (PENNSAID) 2 % SOLN Apply 1 application topically as needed.     escitalopram (LEXAPRO) 10 MG tablet Take 10 mg by mouth daily.     ketoconazole (NIZORAL) 2 % shampoo Apply topically.     niacin (NIASPAN) 500 MG CR tablet Take by mouth.     rosuvastatin (CRESTOR) 10 MG tablet Take 1 tablet by mouth daily.     valsartan (DIOVAN) 80 MG tablet Take 80 mg by mouth daily.     ezetimibe (ZETIA) 10 MG tablet Take 1 tablet by mouth daily.     No current facility-administered medications for this visit.    Family History  Problem Relation Age of Onset   Breast cancer Mother        DCIS   Hypertension Mother    Diabetes Mother        borderline   Autoimmune disease Mother        giant cell arteritis   Thyroid disease Mother        hypothyrodism   Hypertension Father    Hypertension Sister    Thyroid disease Sister        hypothyroidism   Hypertension Brother     Review of Systems  All other systems reviewed and are negative.  Exam:   BP 138/82  Pulse 92   Ht '5\' 1"'$  (1.549 m)   Wt 117 lb (53.1 kg)   LMP 12/12/2014 (Approximate)   SpO2 97%   BMI 22.11 kg/m     General appearance: alert, cooperative and appears stated age Head: normocephalic, without obvious abnormality, atraumatic Neck: no adenopathy, supple, symmetrical, trachea midline and thyroid normal to inspection and palpation Lungs: clear to auscultation bilaterally Breasts: normal appearance, no masses, bilateral tenderness present.  No nipple retraction or dimpling, No nipple discharge or bleeding, No axillary adenopathy Heart: regular rate and rhythm Abdomen: soft, non-tender; no masses, no organomegaly Extremities: extremities normal, atraumatic, no cyanosis or edema Skin: skin color, texture, turgor normal. No rashes or lesions Lymph nodes: cervical, supraclavicular, and axillary nodes normal. Neurologic: grossly normal  Pelvic: External genitalia:  6 - 7 mm raised slightly white area of the right labia.   Sebaceous cyst also present.               No abnormal inguinal nodes palpated.              Urethra:  normal appearing urethra with no masses, tenderness or lesions              Bartholins and Skenes: normal                 Vagina: normal appearing vagina with normal color and discharge, no lesions              Cervix: no lesions              Pap taken: no Bimanual Exam:  Uterus:  normal size, contour, position, consistency, mobility, non-tender              Adnexa: no mass, fullness, tenderness              Rectal exam: yes.  Confirms.              Anus:  normal sphincter tone, no lesions  Chaperone was present for exam:  Eustace Pen, CMA.   Assessment:   Well woman visit with gynecologic exam. ASCUS pap with negative HR HPV.  Osteopenia.   Menopause. Left breast cancer.  Status post lumpectomy and XRT. Off Tamoxifen.  Bilateral breast pain, left greater than right.   Has been [facility] at [facility].  Fibroids.  Atrophy.    Hx HSV I.  Hx melanoma. Vulvar lesion.   Plan: Mammogram screening discussed. Self breast awareness reviewed. Pap and HR HPV 2023. Guidelines for Calcium, Vitamin D, regular exercise program including cardiovascular and weight bearing exercise. Return for vulvar biopsy.  BMD in 2024.  Follow up annually and prn.   After visit summary provided.

## 2021-09-01 ENCOUNTER — Other Ambulatory Visit: Payer: Self-pay

## 2021-09-01 ENCOUNTER — Other Ambulatory Visit (HOSPITAL_COMMUNITY)
Admission: RE | Admit: 2021-09-01 | Discharge: 2021-09-01 | Disposition: A | Discharge status: Home / Self Care | Payer: Commercial Managed Care - PPO | Source: Ambulatory Visit | Attending: Obstetrics and Gynecology | Admitting: Obstetrics and Gynecology

## 2021-09-01 ENCOUNTER — Encounter: Payer: Self-pay | Admitting: Obstetrics and Gynecology

## 2021-09-01 ENCOUNTER — Ambulatory Visit: Payer: Commercial Managed Care - PPO | Admitting: Obstetrics and Gynecology

## 2021-09-01 VITALS — BP 140/82 | Ht 61.0 in | Wt 113.0 lb

## 2021-09-01 DIAGNOSIS — N9089 Other specified noninflammatory disorders of vulva and perineum: Secondary | ICD-10-CM

## 2021-09-01 DIAGNOSIS — F419 Anxiety disorder, unspecified: Secondary | ICD-10-CM

## 2021-09-01 NOTE — Patient Instructions (Signed)
Vulva Biopsy, Care After This sheet gives you information about how to care for yourself after your procedure. Your doctor may also give you more specific instructions. If you have problems or questions, contact your doctor. What can I expect after the procedure? After the procedure, it is common to have: Slight bleeding from the biopsy site. Soreness at the biopsy site. Follow these instructions at home: Biopsy site care  Follow instructions from your doctor about how to take care of your biopsy site. Make sure you: Clean the area using water and mild soap two times a day or as told by your doctor. Gently pat the area dry. If you were prescribed an antibiotic ointment, apply it as told by your doctor. Do not stop using the antibiotic even if your condition gets better. Take a warm water bath (sitz bath) as needed to help with pain. A sitz bath is taken while you are sitting down. The water should only come up to your hips and cover your butt. Leave stitches (sutures), skin glue, or skin tape (adhesive) strips in place. They may need to stay in place for 2 weeks or longer. If tape strips get loose and curl up, you may trim the loose edges. Do not remove tape strips completely unless your doctor says it is okay. Check your biopsy area every day for signs of infection. Check for: More redness, swelling, or pain. More fluid or blood. Warmth. Pus or a bad smell. Do not rub the biopsy area after peeing (urinating). Gently pat the area dry, or use a bottle filled with warm water (peri-bottle) to clean the area. Gently wipe from front to back. Lifestyle Wear loose, cotton underwear. Do not wear tight pants. Do not use a tampon, douche, or put anything in your vagina for at least 1 week or until your doctor says it is okay. Do not have sex for at least 1 week or until your doctor says it is okay. Do not exercise until your doctor says it is okay. Do not swim or use a hot tub until your doctor  says it is okay. You may shower or take a sitz bath. General instructions Take over-the-counter and prescription medicines only as told by your doctor. Use a sanitary pad until the bleeding stops. Keep all follow-up visits as told by your doctor. This is important. Contact a doctor if: You have more redness, swelling, or pain around your biopsy site. You have more fluid or blood coming from your biopsy site. Your biopsy site feels warm when you touch it. Medicines do not help with your pain. Get help right away if: You have a lot of bleeding from the vulva. You have pus or a bad smell coming from your biopsy site. You have a fever. You have pain in the lower belly (abdomen). Summary After the procedure, it is common to have slight bleeding and soreness at the biopsy site. Follow all instructions as told by your doctor. Clean the area with water and mild soap. Do not rub. Pat the area dry. Take sitz baths as needed. Leave any stitches in place. Check your biopsy site for infection. Signs include more redness, swelling, pain, fluid, or blood, or feeling warm when you touch it. Get help right away if you have a lot of bleeding, a fever, pus or a bad smell, or pain in your lower belly. This information is not intended to replace advice given to you by your health care provider. Make sure you discuss any  questions you have with your health care provider. Document Revised: 05/31/2018 Document Reviewed: 05/31/2018 Elsevier Patient Education  2022 [employer].

## 2021-09-01 NOTE — Telephone Encounter (Signed)
Ok to proceed with biopsy.

## 2021-09-01 NOTE — Progress Notes (Signed)
GYNECOLOGY  VISIT   HPI: 60 y.o.   Married  Caucasian  female   G0P0000 with Patient's last menstrual period was 12/12/2014 (approximate).   here for vulvar biopsy.   Dealing with anxiety.  States she has always been anxious.  Is now on Lexapro but is dealing with situations that are prompting the anxiety to increase.  States she was informed from [facility] that her private health information was accidentally sent to [facility].   GYNECOLOGIC HISTORY: Patient's last menstrual period was 12/12/2014 (approximate). Contraception: PMP Menopausal hormone therapy:  none Last mammogram:  05-18-21 MRI BR/Neg/BiRads 1--care everywhere Last pap smear:   01-21-19 ASCUS:Neg HR HPV, 11-17-17 Neg:Neg HR HPV, 11-12-14 Neg        OB History     Gravida  0   Para  0   Term  0   Preterm  0   AB  0   Living  0      SAB  0   IAB  0   Ectopic  0   Multiple  0   Live Births  0              Patient Active Problem List   Diagnosis Date Noted   Postmenopausal bleeding 09/08/2018   Recurrent vaginitis 09/08/2018   H/O abnormal cervical Papanicolaou smear 06/20/2018   Acquired trigger finger of right ring finger 03/22/2018   Pain in joint of right shoulder 03/22/2018   Hypercholesterolemia 07/20/2017   Allergy status to anesthetic agent status 03/01/2017   Malignant neoplasm of overlapping sites of left breast in female, estrogen receptor positive ([place]) 01/09/2017   Dysplastic nevus 06/27/2016   Insomnia 01/08/2014   Anxiety 01/08/2014   Rhytides 11/26/2013   Alopecia 07/11/2013   Allergic rhinitis 10/20/2011   Vitamin D deficiency 06/07/2011   Malignant melanoma of skin ([place]) 03/28/2011   Benign neoplasm of skin 02/07/2011    Past Medical History:  Diagnosis Date   Abnormal Pap smear of cervix 1990   Posible cryotherapy.   Anxiety    Cancer ([place]) 03/02/2017   left breast cancer   Fibroids    HSV-1 infection    HTN (hypertension)    Melanoma ([place])    left tricep    Osteopenia     Past Surgical History:  Procedure Laterality Date   BREAST SURGERY  03/02/2017   left lumpectomy--[facility]   MELANOMA EXCISION     left tricep   TRIGGER FINGER RELEASE Right 04/25/2018   right ring finger    Current Outpatient Medications  Medication Sig Dispense Refill   ascorbic Acid (VITAMIN C) 500 MG CPCR Take by mouth.     aspirin 81 MG EC tablet      celecoxib (CELEBREX) 200 MG capsule Take 200 mg by mouth daily as needed.     Cholecalciferol (VITAMIN D3 PO) Take 1 capsule by mouth daily.     Diclofenac Sodium (PENNSAID) 2 % SOLN Apply 1 application topically as needed.     escitalopram (LEXAPRO) 10 MG tablet Take 10 mg by mouth daily.     ketoconazole (NIZORAL) 2 % shampoo Apply topically.     niacin (NIASPAN) 500 MG CR tablet Take by mouth.     rosuvastatin (CRESTOR) 10 MG tablet Take 1 tablet by mouth daily.     valsartan (DIOVAN) 80 MG tablet Take 80 mg by mouth daily.     ezetimibe (ZETIA) 10 MG tablet Take 1 tablet by mouth daily.  No current facility-administered medications for this visit.     ALLERGIES: Benzocaine, Epinephrine, Lanolin, Other, Paba derivatives, Nickel, Prilocaine, Procaine, and Tetracaine  Family History  Problem Relation Age of Onset   Breast cancer Mother        DCIS   Hypertension Mother    Diabetes Mother        borderline   Autoimmune disease Mother        giant cell arteritis   Thyroid disease Mother        hypothyrodism   Hypertension Father    Hypertension Sister    Thyroid disease Sister        hypothyroidism   Hypertension Brother     Social History   Socioeconomic History   Marital status: Married    Spouse name: Not on file   Number of children: Not on file   Years of education: Not on file   Highest education level: Not on file  Occupational History   Not on file  Tobacco Use   Smoking status: Never   Smokeless tobacco: Never  Vaping Use   Vaping Use: Never used  Substance and Sexual Activity    Alcohol use: No   Drug use: No   Sexual activity: Not Currently    Birth control/protection: Abstinence, Post-menopausal  Other Topics Concern   Not on file  Social History Narrative   Not on file   Social Determinants of Health   Financial Resource Strain: Not on file  Food Insecurity: Not on file  Transportation Needs: Not on file  Physical Activity: Not on file  Stress: Not on file  Social Connections: Not on file  Intimate Partner Violence: Not on file    Review of Systems  All other systems reviewed and are negative.  PHYSICAL EXAMINATION:    BP 140/82   Ht 5\' 1"  (1.549 m)   Wt 113 lb (51.3 kg)   LMP 12/12/2014 (Approximate)   BMI 21.35 kg/m     General appearance: alert, cooperative and appears stated age  Pelvic: External genitalia:  right labia majora with 5 mm raised grey verrucous lesion.   Vulvar biopsy Consent done.  Sterile prep with betadine.  Local 1% lidocaine, lot [id number], exp 03/2023. 4 mm punch biopsy.  Tissue to pathology.  No complications.  Minimal EBL.    Chaperone was present for exam:  Estill Bamberg, CMA  ASSESSMENT  Right vulvar lesion.  Anxiety.   PLAN  Fu biopsy.   Final plan to follow.  Continue Lexapro.  I recommended she contact [facility] to understand better the situation of the privacy of her health information.  I have also recommended counseling for anxiety to reduce stress and worry.  Fu prn.    An After Visit Summary was printed and given to the patient.  30 min  total time was spent for this patient encounter, including preparation, face-to-face counseling with the patient, coordination of care, and documentation of the encounter.

## 2021-09-03 ENCOUNTER — Encounter: Payer: Self-pay | Admitting: Obstetrics and Gynecology

## 2021-09-03 LAB — SURGICAL PATHOLOGY

## 2021-10-27 ENCOUNTER — Ambulatory Visit: Payer: Commercial Managed Care - PPO | Admitting: Obstetrics and Gynecology

## 2022-04-04 NOTE — Progress Notes (Signed)
GYNECOLOGY  VISIT ?  ?HPI: ?61 y.o.   Married  Caucasian  female   ?G0P0000 with Patient's last menstrual period was 12/12/2014 (approximate).   ?here for urinary urgency for several months.  ? ?Some urinary incontinence if cannot get to bathroom on time.  ? ?DF - can be every 30 minutes in the am. Then every 2 hours for the rest of the day. ?NF - none.  ?No enuresis.  ? ?Did leak when she was getting out of bed recently.  ? ?Sometimes leaks with cough, laugh, and sneeze. ?No leak with exercise like aerobics.  ? ?No pain with urination.  ?No blood in the urine.  ? ?2 UTIs in her life per patient.  ? ?2 cups of regular coffee in the am.  ?Decaf tea.  ?Decaf soda.  ? ?Doing Kegel's.  ? ?Taking medication for elevated blood pressure.  ? ?Saw her oncologist recently and asked about the use of local vaginal estrogen. ?Her oncologist approved low dose local vaginal estrogen.  ?See [facility] note form Epic in March 2023.  ? ?Has SVT.  ?Normal stress test per patient.  ? ?No narrow angle glaucoma.  ? ?GYNECOLOGIC HISTORY: ?Patient's last menstrual period was 12/12/2014 (approximate). ?Contraception:  PMP ?Menopausal hormone therapy:  none ?Last mammogram:  05-18-21 MRI BR/Neg/BiRads 1 ?Last pap smear:  01-21-19 ASCUS:Neg HR HPV, 11-17-17 Neg:Neg HR HPV, 11-12-14 Neg ?       ?       ?OB History   ? ? Gravida  ?0  ? Para  ?0  ? Term  ?0  ? Preterm  ?0  ? AB  ?0  ? Living  ?0  ?  ? ? SAB  ?0  ? IAB  ?0  ? Ectopic  ?0  ? Multiple  ?0  ? Live Births  ?0  ?   ?  ?  ?    ? ?Patient Active Problem List  ? Diagnosis Date Noted  ? Postmenopausal bleeding 09/08/2018  ? Recurrent vaginitis 09/08/2018  ? H/O abnormal cervical Papanicolaou smear 06/20/2018  ? Acquired trigger finger of right ring finger 03/22/2018  ? Pain in joint of right shoulder 03/22/2018  ? Hypercholesterolemia 07/20/2017  ? Allergy status to anesthetic agent status 03/01/2017  ? Malignant neoplasm of overlapping sites of left breast in female, estrogen receptor positive  ([place]) 01/09/2017  ? Dysplastic nevus 06/27/2016  ? Insomnia 01/08/2014  ? Anxiety 01/08/2014  ? Rhytides 11/26/2013  ? Alopecia 07/11/2013  ? Allergic rhinitis 10/20/2011  ? Vitamin D deficiency 06/07/2011  ? Malignant melanoma of skin ([place]) 03/28/2011  ? Benign neoplasm of skin 02/07/2011  ? ? ?Past Medical History:  ?Diagnosis Date  ? Abnormal Pap smear of cervix 1990  ? Posible cryotherapy.  ? Anxiety   ? Cancer ([place]) 03/02/2017  ? left breast cancer  ? Fibroids   ? HSV-1 infection   ? HTN (hypertension)   ? Melanoma ([place])   ? left tricep  ? Osteopenia   ? ? ?Past Surgical History:  ?Procedure Laterality Date  ? BREAST SURGERY  03/02/2017  ? left lumpectomy--[facility]  ? MELANOMA EXCISION    ? left tricep  ? TRIGGER FINGER RELEASE Right 04/25/2018  ? right ring finger  ? ? ?Current Outpatient Medications  ?Medication Sig Dispense Refill  ? albuterol (VENTOLIN HFA) 108 (90 Base) MCG/ACT inhaler Inhale into the lungs.    ? celecoxib (CELEBREX) 200 MG capsule Take 200 mg by mouth daily as needed.    ?  Cholecalciferol (VITAMIN D3 PO) Take 1 capsule by mouth daily.    ? Diclofenac Sodium (PENNSAID) 2 % SOLN Apply 1 application topically as needed.    ? escitalopram (LEXAPRO) 10 MG tablet Take 10 mg by mouth daily.    ? ezetimibe (ZETIA) 10 MG tablet Take by mouth.    ? ketoconazole (NIZORAL) 2 % shampoo Apply topically.    ? metoprolol succinate (TOPROL-XL) 25 MG 24 hr tablet Take by mouth.    ? valsartan (DIOVAN) 40 MG tablet PLEASE SEE ATTACHED FOR DETAILED DIRECTIONS    ? VITAMIN D, CHOLECALCIFEROL, PO Take by mouth. 5000 IUdaily    ? ?No current facility-administered medications for this visit.  ?  ? ?ALLERGIES: Niacin, Benzocaine, Epinephrine, Lanolin, Other, Paba derivatives, Nickel, Prilocaine, Procaine, and Tetracaine ? ?Family History  ?Problem Relation Age of Onset  ? Breast cancer Mother   ?     DCIS  ? Hypertension Mother   ? Diabetes Mother   ?     borderline  ? Autoimmune disease Mother   ?     giant cell  arteritis  ? Thyroid disease Mother   ?     hypothyrodism  ? Hypertension Father   ? Hypertension Sister   ? Thyroid disease Sister   ?     hypothyroidism  ? Hypertension Brother   ? ? ?Social History  ? ?Socioeconomic History  ? Marital status: Married  ?  Spouse name: Not on file  ? Number of children: Not on file  ? Years of education: Not on file  ? Highest education level: Not on file  ?Occupational History  ? Not on file  ?Tobacco Use  ? Smoking status: Never  ? Smokeless tobacco: Never  ?Vaping Use  ? Vaping Use: Never used  ?Substance and Sexual Activity  ? Alcohol use: No  ? Drug use: No  ? Sexual activity: Not Currently  ?  Birth control/protection: Abstinence, Post-menopausal  ?Other Topics Concern  ? Not on file  ?Social History Narrative  ? Not on file  ? ?Social Determinants of Health  ? ?Financial Resource Strain: Not on file  ?Food Insecurity: Not on file  ?Transportation Needs: Not on file  ?Physical Activity: Not on file  ?Stress: Not on file  ?Social Connections: Not on file  ?Intimate Partner Violence: Not on file  ? ? ?Review of Systems  ?Genitourinary:  Positive for urgency.  ?All other systems reviewed and are negative. ? ?PHYSICAL EXAMINATION:   ? ?BP 122/70   Pulse 66   Ht '5\' 1"'$  (1.549 m)   Wt 113 lb (51.3 kg)   LMP 12/12/2014 (Approximate)   SpO2 97%   BMI 21.35 kg/m?     ?General appearance: alert, cooperative and appears stated age ? ?ASSESSMENT ? ?Urinary urgency.  ?Fibroids.  ?Atrophy.  ?Hx left breast cancer.   ?HTN.  ?SVT. ? ?PLAN ? ?Urinalysis and culture sent.  ?Avoid bladder irritants.   ?Start Ditropan XL 5 mg daily.  Side effects discussed.  ?She will try over the counter vaginal vitamin E suppositories.  ?She will avoid vaginal estrogens.  ?Referral for pelvic floor therapy. ?Fu in 6 weeks.  ?  ?An After Visit Summary was printed and given to the patient. ? ?40 min  total time was spent for this patient encounter, including preparation, face-to-face counseling with the  patient, coordination of care, and documentation of the encounter. ? ? ?

## 2022-04-06 ENCOUNTER — Telehealth: Payer: Self-pay | Admitting: Obstetrics and Gynecology

## 2022-04-06 ENCOUNTER — Ambulatory Visit: Payer: Commercial Managed Care - PPO | Admitting: Obstetrics and Gynecology

## 2022-04-06 ENCOUNTER — Encounter: Payer: Self-pay | Admitting: Obstetrics and Gynecology

## 2022-04-06 VITALS — BP 122/70 | HR 66 | Ht 61.0 in | Wt 113.0 lb

## 2022-04-06 DIAGNOSIS — N3946 Mixed incontinence: Secondary | ICD-10-CM

## 2022-04-06 DIAGNOSIS — N952 Postmenopausal atrophic vaginitis: Secondary | ICD-10-CM | POA: Diagnosis not present

## 2022-04-06 DIAGNOSIS — R3915 Urgency of urination: Secondary | ICD-10-CM

## 2022-04-06 MED ORDER — OXYBUTYNIN CHLORIDE ER 5 MG PO TB24: ORAL_TABLET | ORAL | 1 refills | Status: DC

## 2022-04-06 NOTE — Telephone Encounter (Signed)
Please make a referral for pelvic floor therapy at [facility] for mixed incontinence for my patient.  ?

## 2022-04-06 NOTE — Patient Instructions (Signed)
Oxybutynin Extended-Release Tablets ?What is this medication? ?OXYBUTYNIN (ox i BYOO ti nin) treats symptoms of an overactive bladder, such as loss of bladder control or frequent need to urinate. It works by relaxing muscles in the bladder. It belongs to a group of medications called antispasmodics. ?This medicine may be used for other purposes; ask your health care provider or pharmacist if you have questions. ?COMMON BRAND NAME(S): Ditropan XL ?What should I tell my care team before I take this medication? ?They need to know if you have any of these conditions: ?Autonomic neuropathy ?Dementia ?Difficulty passing urine ?Glaucoma ?Intestinal obstruction ?Kidney disease ?Liver disease ?Myasthenia gravis ?Parkinson's disease ?An unusual or allergic reaction to oxybutynin, other medications, foods, dyes, or preservatives ?Pregnant or trying to get pregnant ?Breast-feeding ?How should I use this medication? ?Take this medication by mouth with a glass of water. Swallow whole, do not crush, cut, or chew. Follow the directions on the prescription label. You can take this medication with or without food. Take your doses at regular intervals. Do not take your medication more often than directed. ?Talk to your care team about the use of this medication in children. Special care may be needed. While this medication may be prescribed for children as young as 6 years for selected conditions, precautions do apply. ?Overdosage: If you think you have taken too much of this medicine contact a poison control center or emergency room at once. ?NOTE: This medicine is only for you. Do not share this medicine with others. ?What if I miss a dose? ?If you miss a dose, take it as soon as you can. If it is almost time for your next dose, take only that dose. Do not take double or extra doses. ?What may interact with this medication? ?Antihistamines for allergy, cough and cold ?Atropine ?Certain medications for bladder problems like  oxybutynin, tolterodine ?Certain medications for Parkinson's disease like benztropine, trihexyphenidyl ?Certain medications for stomach problems like dicyclomine, hyoscyamine ?Certain medications for travel sickness like scopolamine ?Clarithromycin ?Erythromycin ?Ipratropium ?Medications for fungal infections, like fluconazole, itraconazole, ketoconazole or voriconazole ?This list may not describe all possible interactions. Give your health care provider a list of all the medicines, herbs, non-prescription drugs, or dietary supplements you use. Also tell them if you smoke, drink alcohol, or use illegal drugs. Some items may interact with your medicine. ?What should I watch for while using this medication? ?It may take a few weeks to notice the full benefit from this medication. ?You may need to limit your intake tea, coffee, caffeinated sodas, and alcohol. These drinks may make your symptoms worse. ?You may get drowsy or dizzy. Do not drive, use machinery, or do anything that needs mental alertness until you know how this medication affects you. Do not stand or sit up quickly, especially if you are an older patient. This reduces the risk of dizzy or fainting spells. Alcohol may interfere with the effect of this medication. Avoid alcoholic drinks. ?Your mouth may get dry. Chewing sugarless gum or sucking hard candy, and drinking plenty of water may help. Contact your care team if the problem does not go away or is severe. ?This medication may cause dry eyes and blurred vision. If you wear contact lenses, you may feel some discomfort. Lubricating drops may help. See your eyecare professional if the problem does not go away or is severe. ?You may notice the shells of the tablets in your stool from time to time. This is normal. ?Avoid extreme heat. This medication can cause  you to sweat less than normal. Your body temperature could increase to dangerous levels, which may lead to heat stroke. ?What side effects may I  notice from receiving this medication? ?Side effects that you should report to your care team as soon as possible: ?Allergic reactions or angioedema--skin rash, itching, hives, swelling of the face, eyes, lips, tongue, arms, or legs, trouble swallowing or breathing ?Sudden eye pain or change in vision such as blurry vision, seeing halos around lights, vision loss ?Trouble passing urine ?Side effects that usually do not require medical attention (report to your care team if they continue or are bothersome): ?Confusion ?Constipation ?Dizziness ?Drowsiness ?Dry mouth ?Headache ?This list may not describe all possible side effects. Call your doctor for medical advice about side effects. You may report side effects to FDA at 1-800-FDA-1088. ?Where should I keep my medication? ?Keep out of the reach of children. ?Store at room temperature between 15 and 30 degrees C (59 and 86 degrees F). Protect from moisture and humidity. Throw away any unused medication after the expiration date. ?NOTE: This sheet is a summary. It may not cover all possible information. If you have questions about this medicine, talk to your doctor, pharmacist, or health care provider. ?? 2023 Elsevier/Gold Standard (2021-04-14 00:00:00) ?Urinary Incontinence ?Urinary incontinence refers to a condition in which a person is unable to control where and when to pass urine. A person with this condition will urinate involuntarily. This means that the person urinates when he or she does not mean to. ?What are the causes? ?This condition may be caused by: ?Medicines. ?Infections. ?Constipation. ?Overactive bladder muscles. ?Weak bladder muscles. ?Weak pelvic floor muscles. These muscles provide support for the bladder, intestine, and, in women, the uterus. ?Enlarged prostate in men. The prostate is a gland near the bladder. When it gets too big, it can pinch the urethra. With the urethra blocked, the bladder can weaken and lose the ability to empty  properly. ?Surgery. ?Emotional factors, such as anxiety, stress, or post-traumatic stress disorder (PTSD). ?Spinal cord injury, nerve injury, or other neurological conditions. ?Pelvic organ prolapse. This happens in women when organs move out of place and into the vagina. This movement can prevent the bladder and urethra from working properly. ?What increases the risk? ?The following factors may make you more likely to develop this condition: ?Age. The older you are, the higher the risk. ?Obesity. ?Being physically inactive. ?Pregnancy and childbirth. ?Menopause. ?Diseases that affect the nerves or spinal cord. ?Long-term, or chronic, coughing. This can increase pressure on the bladder and pelvic floor muscles. ?What are the signs or symptoms? ?Symptoms may vary depending on the type of urinary incontinence you have. They include: ?A sudden urge to urinate, and passing urine involuntarily before you can get to a bathroom (urge incontinence). ?Suddenly passing urine when doing activities that force urine to pass, such as coughing, laughing, exercising, or sneezing (stress incontinence). ?Needing to urinate often but urinating only a small amount, or constantly dribbling urine (overflow incontinence). ?Urinating because you cannot get to the bathroom in time due to a physical disability, such as arthritis or injury, or due to a communication or thinking problem, such as Alzheimer's disease (functional incontinence). ?How is this diagnosed? ?This condition may be diagnosed based on: ?Your medical history. ?A physical exam. ?Tests, such as: ?Urine tests. ?X-rays of your kidney and bladder. ?Ultrasound. ?CT scan. ?Cystoscopy. In this procedure, a health care provider inserts a tube with a light and camera (cystoscope) through the urethra and  into the bladder to check for problems. ?Urodynamic testing. These tests assess how well the bladder, urethra, and sphincter can store and release urine. There are different types of  urodynamic tests, and they vary depending on what the test is measuring. ?To help diagnose your condition, your health care provider may recommend that you keep a log of when you urinate and how much you urinate. ?How is

## 2022-04-06 NOTE — Telephone Encounter (Signed)
Epic Order placed. ?

## 2022-04-07 ENCOUNTER — Ambulatory Visit: Payer: Commercial Managed Care - PPO | Admitting: Physical Therapy

## 2022-04-08 LAB — URINALYSIS, COMPLETE W/RFL CULTURE
Bilirubin Urine: NEGATIVE
Glucose, UA: NEGATIVE
Hgb urine dipstick: NEGATIVE
Hyaline Cast: NONE SEEN /LPF
Ketones, ur: NEGATIVE
Nitrites, Initial: NEGATIVE
Protein, ur: NEGATIVE
RBC / HPF: NONE SEEN /HPF (ref 0–2)
Specific Gravity, Urine: 1.01 (ref 1.001–1.035)
pH: 6 (ref 5.0–8.0)

## 2022-04-08 LAB — URINE CULTURE
MICRO NUMBER:: 13315007
Result:: NO GROWTH
SPECIMEN QUALITY:: ADEQUATE

## 2022-04-08 LAB — CULTURE INDICATED

## 2022-04-25 ENCOUNTER — Ambulatory Visit: Payer: Commercial Managed Care - PPO

## 2022-04-25 NOTE — Therapy (Incomplete)
?OUTPATIENT PHYSICAL THERAPY FEMALE PELVIC EVALUATION ? ? ?Patient Name: Tonya Yang ?MRN: [id number] ?DOB:06/18/1961, 61 y.o., female ?Today's Date: 04/25/2022 ? ? ? ?Past Medical History:  ?Diagnosis Date  ? Abnormal Pap smear of cervix 1990  ? Posible cryotherapy.  ? Anxiety   ? Cancer ([place]) 03/02/2017  ? left breast cancer  ? Fibroids   ? HSV-1 infection   ? HTN (hypertension)   ? Melanoma ([place])   ? left tricep  ? Osteopenia   ? ?Past Surgical History:  ?Procedure Laterality Date  ? BREAST SURGERY  03/02/2017  ? left lumpectomy--[facility]  ? MELANOMA EXCISION    ? left tricep  ? TRIGGER FINGER RELEASE Right 04/25/2018  ? right ring finger  ? ?Patient Active Problem List  ? Diagnosis Date Noted  ? Postmenopausal bleeding 09/08/2018  ? Recurrent vaginitis 09/08/2018  ? H/O abnormal cervical Papanicolaou smear 06/20/2018  ? Acquired trigger finger of right ring finger 03/22/2018  ? Pain in joint of right shoulder 03/22/2018  ? Hypercholesterolemia 07/20/2017  ? Allergy status to anesthetic agent status 03/01/2017  ? Malignant neoplasm of overlapping sites of left breast in female, estrogen receptor positive ([place]) 01/09/2017  ? Dysplastic nevus 06/27/2016  ? Insomnia 01/08/2014  ? Anxiety 01/08/2014  ? Rhytides 11/26/2013  ? Alopecia 07/11/2013  ? Allergic rhinitis 10/20/2011  ? Vitamin D deficiency 06/07/2011  ? Malignant melanoma of skin ([place]) 03/28/2011  ? Benign neoplasm of skin 02/07/2011  ? ? ?PCP: Scherr, Deliah Goody ? ?REFERRING PROVIDER: Nunzio Cobbs, MD ? ?REFERRING DIAG: N39.46 (ICD-10-CM) - Mixed incontinence ? ?THERAPY DIAG:  ?No diagnosis found. ? ?ONSET DATE: *** ? ?SUBJECTIVE:                                                                                                                                                                                          ? ?SUBJECTIVE STATEMENT: ?*** ?Fluid intake: Yes: 2 cups of coffee in the am, decaf tea later   ? ?Patient confirms identification and  approves PT to assess pelvic floor and treatment Yes ? ? ?PAIN:  ?Are you having pain? {yes/no:20286} ? ? ?PRECAUTIONS: None ? ?WEIGHT BEARING RESTRICTIONS No ? ?FALLS:  ?Has patient fallen in last 6 months? No ? ?LIVING ENVIRONMENT: ?Lives with: lives with their family ?Lives in: House/apartment ? ?OCCUPATION: *** ? ?PLOF: Independent ? ?PATIENT GOALS Decrease urinary urgency/incontiennce ? ?PERTINENT HISTORY:  ?*** ?Sexual abuse: {Yes/No:304960894} ? ?BOWEL MOVEMENT ?Pain with bowel movement: {yes/no:20286} ?Type of bowel movement:{PT BM type:27100} ?Fully empty rectum: {Yes/No:304960894} ?Leakage: {Yes/No:304960894} ?Pads: {Yes/No:304960894} ?Fiber supplement: {Yes/No:304960894} ? ?URINATION ?Pain with urination: {yes/no:20286} ?Fully empty bladder: {  VEL/FY:101751025} ?Stream: {PT urination:27102} ?Urgency: {Yes/No:304960894} ?Frequency: *** ?Leakage: {PT leakage:27103} ?Pads: {Yes/No:304960894} ? ?INTERCOURSE ?Pain with intercourse: {pain with intercourse PA:27099} ?Ability to have vaginal penetration:  {Yes/No:304960894} ?[place]: *** ?Marinoff Scale: ***/3 ? ?PREGNANCY ?Vaginal deliveries 0 ? ?C-section deliveries 0 ? ? ?OBJECTIVE:  ? ? ?COGNITION: ? Overall cognitive status: Within functional limits for tasks assessed   ?  ?SENSATION: ? Light touch: Appears intact ? Proprioception: Appears intact ? ?MUSCLE LENGTH: ?Hamstrings: Right *** deg; Left *** deg ?Thomas test: Right *** deg; Left *** deg ? ?LUMBAR SPECIAL TESTS:  ?{lumbar special test:25242} ? ?FUNCTIONAL TESTS:  ?{Functional tests:24029} ? ?GAIT: ? ?Comments: *** ? ?POSTURE:  ?*** ? ?LUMBARAROM/PROM ? ?A/PROM A/PROM  ?04/25/2022  ?Flexion   ?Extension   ?Right lateral flexion   ?Left lateral flexion   ?Right rotation   ?Left rotation   ? (Blank rows = not tested) ? ? ?LE MMT: ? ?MMT Right ?04/25/2022 Left ?04/25/2022  ?Hip flexion    ?Hip extension    ?Hip abduction    ?Hip adduction    ?Hip internal rotation    ?Hip external rotation    ?Knee flexion     ?Knee extension    ?Ankle dorsiflexion    ?Ankle plantarflexion    ?Ankle inversion    ?Ankle eversion    ? ?PELVIC MMT: ?  ? ? ?      PALPATION: ?  General  *** ? ?              External Perineal Exam *** ?              ?              Internal Pelvic Floor *** ? ?TONE: ?*** ? ?PROLAPSE: ?*** ? ?TODAY'S TREATMENT  ?EVAL *** ? ? ?PATIENT EDUCATION:  ?Education details: *** ?Person educated: Patient ?Education method: Explanation, Demonstration, Tactile cues, Verbal cues, and Handouts ?Education comprehension: verbalized understanding ? ? ?HOME EXERCISE PROGRAM: ?*** ? ?ASSESSMENT: ? ?CLINICAL IMPRESSION: ?Patient is a 61 y.o. female who was seen today for physical therapy evaluation and treatment for ***.  ? ? ?OBJECTIVE IMPAIRMENTS {opptimpairments:25111}.  ? ?ACTIVITY LIMITATIONS {activity limitations:25113}.  ? ?PERSONAL FACTORS {Personal factors:25162} are also affecting patient's functional outcome.  ? ? ?REHAB POTENTIAL: {rehabpotential:25112} ? ?CLINICAL DECISION MAKING: {clinical decision making:25114} ? ?EVALUATION COMPLEXITY: {Evaluation complexity:25115} ? ? ?GOALS: ?Goals reviewed with patient? Yes ? ?SHORT TERM GOALS: Target date: 05/23/2022  ? ?Pt will be independent with HEP.  ? ?Baseline: ?Goal status: {GOALSTATUS:25110} ? ?2.  *** ?Baseline:  ?Goal status: {GOALSTATUS:25110} ? ?3.  *** ?Baseline:  ?Goal status: {GOALSTATUS:25110} ? ?4.  *** ?Baseline:  ?Goal status: {GOALSTATUS:25110} ? ?5.  *** ?Baseline:  ?Goal status: {GOALSTATUS:25110} ? ?6.  *** ?Baseline:  ?Goal status: {GOALSTATUS:25110} ? ?LONG TERM GOALS: Target date: 07/18/2022   ?Pt will be independent with advanced HEP.  ? ?Baseline:  ?Goal status: INITIAL ? ?2.  *** ?Baseline:  ?Goal status: {GOALSTATUS:25110} ? ?3.  *** ?Baseline:  ?Goal status: {GOALSTATUS:25110} ? ?4.  *** ?Baseline:  ?Goal status: {GOALSTATUS:25110} ? ?5.  *** ?Baseline:  ?Goal status: {GOALSTATUS:25110} ? ?6.  *** ?Baseline:  ?Goal status:  {GOALSTATUS:25110} ? ?PLAN: ?PT FREQUENCY: 1x/week ? ?PT DURATION: 12 weeks ? ?PLANNED INTERVENTIONS: Therapeutic exercises, Therapeutic activity, Neuromuscular re-education, Balance training, Gait training, Patient/Family education, Joint mobilization, Dry Needling, Biofeedback, and Manual therapy ? ?PLAN FOR NEXT SESSION: *** ? ? ?Heather Roberts, PT, DPT05/15/237:51 AM ? ? ?

## 2022-04-28 ENCOUNTER — Encounter: Payer: Self-pay | Admitting: Obstetrics and Gynecology

## 2022-04-28 NOTE — Therapy (Signed)
OUTPATIENT PHYSICAL THERAPY FEMALE PELVIC EVALUATION   Patient Name: Tonya Yang MRN: [id number] DOB:15-Jul-1961, 61 y.o., female Today's Date: 04/29/2022   PT End of Session - 04/29/22 1104     Visit Number 1    Date for PT Re-Evaluation 07/22/22    Authorization Type [employer]    PT Start Time 1104    PT Stop Time 1144    PT Time Calculation (min) 40 min             Past Medical History:  Diagnosis Date   Abnormal Pap smear of cervix 1990   Posible cryotherapy.   Anxiety    Cancer ([place]) 03/02/2017   left breast cancer   Fibroids    HSV-1 infection    HTN (hypertension)    Melanoma ([place])    left tricep   Osteopenia    Past Surgical History:  Procedure Laterality Date   BREAST SURGERY  03/02/2017   left lumpectomy--[facility]   MELANOMA EXCISION     left tricep   TRIGGER FINGER RELEASE Right 04/25/2018   right ring finger   Patient Active Problem List   Diagnosis Date Noted   Postmenopausal bleeding 09/08/2018   Recurrent vaginitis 09/08/2018   H/O abnormal cervical Papanicolaou smear 06/20/2018   Acquired trigger finger of right ring finger 03/22/2018   Pain in joint of right shoulder 03/22/2018   Hypercholesterolemia 07/20/2017   Allergy status to anesthetic agent status 03/01/2017   Malignant neoplasm of overlapping sites of left breast in female, estrogen receptor positive ([place]) 01/09/2017   Dysplastic nevus 06/27/2016   Insomnia 01/08/2014   Anxiety 01/08/2014   Rhytides 11/26/2013   Alopecia 07/11/2013   Allergic rhinitis 10/20/2011   Vitamin D deficiency 06/07/2011   Malignant melanoma of skin ([place]) 03/28/2011   Benign neoplasm of skin 02/07/2011    PCP: Threasa Heads, MD  REFERRING PROVIDER: Nunzio Cobbs, MD  REFERRING DIAG: [phone] (ICD-10-CM) - Mixed incontinence  THERAPY DIAG:  Unspecified lack of coordination  Muscle weakness (generalized)  Rationale for Evaluation and Treatment Rehabilitation  ONSET DATE: 2  months  SUBJECTIVE:                                                                                                                                                                                           SUBJECTIVE STATEMENT: Everything was normal.  I drink coffee in the morning and caffeine does irritate my bladder so I go a lot in the morning.   Fluid intake: 2 cups water, decaf tea, milk with dinner and cereal in the morning, 12 oz in coffee  Patient confirms identification  and approves PT to assess pelvic floor and treatment Yes   PAIN:  Are you having pain? No  PRECAUTIONS: None  WEIGHT BEARING RESTRICTIONS No  FALLS:  Has patient fallen in last 6 months? No  LIVING ENVIRONMENT: Lives with: lives with their family and lives with their spouse Lives in: House/apartment   OCCUPATION: No Rec: Exercises: aerobics, dance/aerobic, walking unless Lt knee is hurting PLOF: Independent  PATIENT GOALS normal bladder habits  PERTINENT HISTORY:  Breast cancer Sexual abuse: No  BOWEL MOVEMENT Pain with bowel movement: No Type of bowel movement:Strain No Fully empty rectum: Yes:   Leakage: No Pads: No Fiber supplement: No  URINATION Pain with urination: No Fully empty bladder: Yes:   Stream: Strong Urgency: Yes: when it happens it is enough to change underwear, but not every day or  Frequency: frequent in the morning Leakage: Urge to void and Walking to the bathroom but sneezing is harder Pads: No  INTERCOURSE Pain with intercourse:  unable and unable to have pain free exams Ability to have vaginal penetration:  No Marinoff Scale: 3/3  PREGNANCY Vaginal deliveries None  PROLAPSE None    OBJECTIVE:    COGNITION:  Overall cognitive status: Within functional limits for tasks assessed     MUSCLE LENGTH: Hamstrings: Right 70 deg; Left 70 deg   LUMBAR SPECIAL TESTS:  ASLR pos for transversus abdominus weakness   FUNCTIONAL TESTS:  Single leg stand,  mild trendelenburg  GAIT:  Comments: slight antalgic Rt  POSTURE:  Increased thoracic kyphosis and rounded shoulders  LUMBARAROM/PROM  A/PROM A/PROM  eval  Flexion   Extension   Right lateral flexion   Left lateral flexion   Right rotation   Left rotation    (Blank rows = not tested)  LOWER EXTREMITY ROM:  Passive ROM Right eval Left eval  Hip flexion    Hip extension    Hip abduction    Hip adduction    Hip internal rotation    Hip external rotation    Knee flexion    Knee extension    Ankle dorsiflexion    Ankle plantarflexion    Ankle inversion    Ankle eversion     (Blank rows = not tested)  LOWER EXTREMITY MMT:  MMT Right eval Left eval  Hip flexion    Hip extension 5/5 5/5  Hip abduction 4+/5 4/5  Hip adduction 4+/5 4/5  Hip internal rotation    Hip external rotation    Knee flexion    Knee extension    Ankle dorsiflexion    Ankle plantarflexion    Ankle inversion    Ankle eversion     PELVIC MMT:   MMT eval  Vaginal 3/5; 8 sec; 5 reps slightly longer to relax  Internal Anal Sphincter   External Anal Sphincter   Puborectalis   Diastasis Recti   (Blank rows = not tested)        PALPATION:   General  thoracic and lumbar tight; sacrum stiff bil                External Perineal Exam irritation and dryness to labio minora; tight posterior forchette                             Internal Pelvic Floor tight and tender levators and bulbocavernois and ischiocavernosis  TONE: high  PROLAPSE: none  TODAY'S TREATMENT  EVAL educated on vaginal moisturizers and initial HEP  PATIENT EDUCATION:  Education details: Access Code: [email or web address] Person educated: Patient Education method: Explanation, Demonstration, Tactile cues, Verbal cues, and Handouts Education comprehension: verbalized understanding   HOME EXERCISE PROGRAM: Access Code: [email or web address] URL: [email or web address] Date: 04/29/2022 Prepared by: Jari Favre  Exercises - Happy Baby with Pelvic Floor Lengthening  - 1 x daily - 7 x weekly - 1 sets - 3 reps - 30 hold - Cat Cow to Child's Pose  - 1 x daily - 7 x weekly - 1 sets - 5 reps - 10 sec hold - Standing with Forearms Thoracic Rotation  - 1 x daily - 7 x weekly - 3 sets - 10 reps - Supine Diaphragmatic Breathing  - 3 x daily - 7 x weekly - 1 sets - 10 reps  ASSESSMENT:  CLINICAL IMPRESSION: Patient is a 61 y.o. female who was seen today for physical therapy evaluation and treatment for urge incontinence of the bladder. Pt has fascial restrictions and irritated tissue around the vaginal canal.  Tenderness to levators and cavernosis muscles Lt more than Rt.  Pt has 3/5 contraction without any co-contracting and can sustain for 8 seconds. High tone pelvic floor and fascial restrictions present.  Pt has stiffness throughout the spine and weakness in bil hips Lt>Rt.  Pt will benefit from skilled PT to address all above mentioned impairments and return to maximum function without urgency and leakage.   OBJECTIVE IMPAIRMENTS decreased activity tolerance, decreased coordination, decreased endurance, decreased strength, increased fascial restrictions, increased muscle spasms, impaired tone, postural dysfunction, and pain.   ACTIVITY LIMITATIONS interpersonal relationship, community activity, and exercise, continence .   PERSONAL FACTORS 1 comorbidity: history of breast cancer  are also affecting patient's functional outcome.    REHAB POTENTIAL: Excellent  CLINICAL DECISION MAKING: Evolving/moderate complexity  EVALUATION COMPLEXITY: Moderate   GOALS: Goals reviewed with patient? Yes  SHORT TERM GOALS: Target date: 05/27/2022  Ind with knowledge of urgency and moisturizer handouts Baseline: Goal status: INITIAL  2.  Ind with initial stretches Baseline:  Goal status: INITIAL   LONG TERM GOALS: Target date: 07/22/2022   Pt will be independent with advanced HEP to maintain  improvements made throughout therapy  Baseline:  Goal status: INITIAL  2.  Pt will report 50% reduction of pain due to improvements in posture, strength, and muscle length  Baseline:  Goal status: INITIAL  3.  Pt will have reduced bladder frequency to at most every 1 hour due to improved soft tissue health of the pelvic floor  Baseline:  Goal status: INITIAL  4.  Pt will report urgency decreased at least 50% for more time to get to the bathroom Baseline:  Goal status: INITIAL   PLAN: PT FREQUENCY: 1x/week  PT DURATION: 12 weeks  PLANNED INTERVENTIONS: Therapeutic exercises, Therapeutic activity, Neuromuscular re-education, Balance training, Gait training, Patient/Family education, Joint mobilization, Dry Needling, Electrical stimulation, Cryotherapy, Moist heat, Taping, Biofeedback, Manual therapy, and Re-evaluation  PLAN FOR NEXT SESSION: f/u on moisturizing, give bladder journal, urge techniques, breathing and stretch, internal STM for downtraining   [employer], PT 04/29/2022, 11:06 AM

## 2022-04-29 ENCOUNTER — Other Ambulatory Visit: Payer: Self-pay | Admitting: Obstetrics and Gynecology

## 2022-04-29 ENCOUNTER — Ambulatory Visit: Payer: Commercial Managed Care - PPO | Attending: Obstetrics and Gynecology | Admitting: Physical Therapy

## 2022-04-29 ENCOUNTER — Encounter: Payer: Self-pay | Admitting: Physical Therapy

## 2022-04-29 DIAGNOSIS — N3946 Mixed incontinence: Secondary | ICD-10-CM | POA: Diagnosis present

## 2022-04-29 DIAGNOSIS — R279 Unspecified lack of coordination: Secondary | ICD-10-CM | POA: Insufficient documentation

## 2022-04-29 DIAGNOSIS — M6281 Muscle weakness (generalized): Secondary | ICD-10-CM | POA: Insufficient documentation

## 2022-04-29 DIAGNOSIS — R3915 Urgency of urination: Secondary | ICD-10-CM

## 2022-04-29 NOTE — Telephone Encounter (Signed)
Refill request received for Ditropan.   Call placed to patient. Patient states she has not started medication, would like to try pelvic PT and Vit E suppositories first. She has her first PT appt today. F/u for 05/18/22 cancelled. Patient will call back to reschedule if she starts medication or symptoms do not improve. Advised I will update Dr. Quincy Simmonds and return call if any additional recommendations. Patient agreeable.   Refill refused.   Routing to provider for final review. Patient is agreeable to disposition. Will close encounter.

## 2022-05-02 NOTE — Therapy (Unsigned)
OUTPATIENT PHYSICAL THERAPY TREATMENT NOTE   Patient Name: Tonya Yang MRN: [id number] DOB:08/08/1961, 61 y.o., female Today's Date: 05/03/2022  PCP: Threasa Heads, MD REFERRING PROVIDER: Nunzio Cobbs, MD  END OF SESSION:   PT End of Session - 05/03/22 0939     Visit Number 2    Date for PT Re-Evaluation 07/22/22    Authorization Type [employer]    PT Start Time [phone]    PT Stop Time 1015    PT Time Calculation (min) 39 min    Activity Tolerance Patient tolerated treatment well    Behavior During Therapy [facility] for tasks assessed/performed             Past Medical History:  Diagnosis Date   Abnormal Pap smear of cervix 1990   Posible cryotherapy.   Anxiety    Cancer ([place]) 03/02/2017   left breast cancer   Fibroids    HSV-1 infection    HTN (hypertension)    Melanoma ([place])    left tricep   Osteopenia    Past Surgical History:  Procedure Laterality Date   BREAST SURGERY  03/02/2017   left lumpectomy--[facility]   MELANOMA EXCISION     left tricep   TRIGGER FINGER RELEASE Right 04/25/2018   right ring finger   Patient Active Problem List   Diagnosis Date Noted   Postmenopausal bleeding 09/08/2018   Recurrent vaginitis 09/08/2018   H/O abnormal cervical Papanicolaou smear 06/20/2018   Acquired trigger finger of right ring finger 03/22/2018   Pain in joint of right shoulder 03/22/2018   Hypercholesterolemia 07/20/2017   Allergy status to anesthetic agent status 03/01/2017   Malignant neoplasm of overlapping sites of left breast in female, estrogen receptor positive ([place]) 01/09/2017   Dysplastic nevus 06/27/2016   Insomnia 01/08/2014   Anxiety 01/08/2014   Rhytides 11/26/2013   Alopecia 07/11/2013   Allergic rhinitis 10/20/2011   Vitamin D deficiency 06/07/2011   Malignant melanoma of skin ([place]) 03/28/2011   Benign neoplasm of skin 02/07/2011    REFERRING DIAG: N39.46 (ICD-10-CM) - Mixed incontinence  THERAPY DIAG:  Unspecified lack of  coordination  Muscle weakness (generalized)  Rationale for Evaluation and Treatment Rehabilitation  PERTINENT HISTORY: Breast cancer  PRECAUTIONS: none  SUBJECTIVE: I lost my papers so I haven't done the stretches.  PAIN:  Are you having pain? No   OBJECTIVE: (objective measures completed at initial evaluation unless otherwise dated) MUSCLE LENGTH: Hamstrings: Right 70 deg; Left 70 deg     LUMBAR SPECIAL TESTS:  ASLR pos for transversus abdominus weakness        FUNCTIONAL TESTS:  Single leg stand, mild trendelenburg   GAIT:   Comments: slight antalgic Rt   POSTURE:  Increased thoracic kyphosis and rounded shoulders   LUMBARAROM/PROM   A/PROM A/PROM  eval  Flexion    Extension    Right lateral flexion    Left lateral flexion    Right rotation    Left rotation     (Blank rows = not tested)   LOWER EXTREMITY ROM:   Passive ROM Right eval Left eval  Hip flexion      Hip extension      Hip abduction      Hip adduction      Hip internal rotation      Hip external rotation      Knee flexion      Knee extension      Ankle dorsiflexion      Ankle  plantarflexion      Ankle inversion      Ankle eversion       (Blank rows = not tested)   LOWER EXTREMITY MMT:   MMT Right eval Left eval  Hip flexion      Hip extension 5/5 5/5  Hip abduction 4+/5 4/5  Hip adduction 4+/5 4/5  Hip internal rotation      Hip external rotation      Knee flexion      Knee extension      Ankle dorsiflexion      Ankle plantarflexion      Ankle inversion      Ankle eversion        PELVIC MMT:   MMT eval  Vaginal 3/5; 8 sec; 5 reps slightly longer to relax  Internal Anal Sphincter    External Anal Sphincter    Puborectalis    Diastasis Recti    (Blank rows = not tested)         PALPATION:   General  thoracic and lumbar tight; sacrum stiff bil                 External Perineal Exam irritation and dryness to labio minora; tight posterior forchette                              Internal Pelvic Floor tight and tender levators and bulbocavernois and ischiocavernosis   TONE: high   PROLAPSE: none   TODAY'S TREATMENT  Treatment:05/03/22 Exercises  Down dog in standing at table Happy baby Knees to chest Piriformis stretch Hip rotation  [place] Re-ed Education and cues for coordination of breathing and pelvic floor muscle contracting and relaxing at appropriate times  Self care Urge techniques and bladder diary    PATIENT EDUCATION:  Education details: Access Code: [email or web address] Person educated: Patient Education method: Explanation, Demonstration, Tactile cues, Verbal cues, and Handouts Education comprehension: verbalized understanding     HOME EXERCISE PROGRAM: Access Code: [email or web address] URL: [email or web address] Date: 05/03/2022 Prepared by: Jari Favre  Exercises - Happy Baby with Pelvic Floor Lengthening  - 1 x daily - 7 x weekly - 1 sets - 3 reps - 30 hold - Cat Cow to Child's Pose  - 1 x daily - 7 x weekly - 1 sets - 5 reps - 10 sec hold - Standing with Forearms Thoracic Rotation  - 1 x daily - 7 x weekly - 3 sets - 10 reps - Supine Diaphragmatic Breathing  - 3 x daily - 7 x weekly - 1 sets - 10 reps - Hooklying Small March  - 1 x daily - 7 x weekly - 2 sets - 10 reps - Supine Hip Internal and External Rotation  - 1 x daily - 7 x weekly - 1 sets - 10 reps - 5 sec hold   ASSESSMENT:   CLINICAL IMPRESSION: Patient is here for follow up visit.  Pt reports one episode since the weekend. Pt was given education on urge, reviewed HEP, and she did well with exercises and diaphragmatic breathing.  Pt will benefit from skilled PT to address all above mentioned impairments and return to maximum function without urgency and leakage.     OBJECTIVE IMPAIRMENTS decreased activity tolerance, decreased coordination, decreased endurance, decreased strength, increased fascial restrictions, increased muscle spasms, impaired tone,  postural dysfunction, and pain.    ACTIVITY LIMITATIONS interpersonal relationship, community activity, and exercise, continence .  PERSONAL FACTORS 1 comorbidity: history of breast cancer  are also affecting patient's functional outcome.      REHAB POTENTIAL: Excellent   CLINICAL DECISION MAKING: Evolving/moderate complexity   EVALUATION COMPLEXITY: Moderate     GOALS: Goals reviewed with patient? Yes   SHORT TERM GOALS: Target date: 05/27/2022   Ind with knowledge of urgency and moisturizer handouts Baseline: Goal status: ongoing   2.  Ind with initial stretches Baseline:  Goal status: ongoing    LONG TERM GOALS: Target date: 07/22/2022    Pt will be independent with advanced HEP to maintain improvements made throughout therapy   Baseline:  Goal status: INITIAL   2.  Pt will report 50% reduction of pain due to improvements in posture, strength, and muscle length   Baseline:  Goal status: INITIAL   3.  Pt will have reduced bladder frequency to at most every 1 hour due to improved soft tissue health of the pelvic floor            Baseline:  Goal status: INITIAL   4.  Pt will report urgency decreased at least 50% for more time to get to the bathroom Baseline:  Goal status: INITIAL     PLAN: PT FREQUENCY: 1x/week   PT DURATION: 12 weeks   PLANNED INTERVENTIONS: Therapeutic exercises, Therapeutic activity, Neuromuscular re-education, Balance training, Gait training, Patient/Family education, Joint mobilization, Dry Needling, Electrical stimulation, Cryotherapy, Moist heat, Taping, Biofeedback, Manual therapy, and Re-evaluation   PLAN FOR NEXT SESSION: f/u on bladder journal, urge techniques, breathing and stretch, internal STM for down training as needed; thoracic and lumbar STM and progress core strength   Camillo Flaming Derric Dealmeida, PT 05/03/2022, 9:39 AM

## 2022-05-03 ENCOUNTER — Encounter: Payer: Self-pay | Admitting: Physical Therapy

## 2022-05-03 ENCOUNTER — Ambulatory Visit: Payer: Commercial Managed Care - PPO | Admitting: Physical Therapy

## 2022-05-03 DIAGNOSIS — R279 Unspecified lack of coordination: Secondary | ICD-10-CM

## 2022-05-03 DIAGNOSIS — M6281 Muscle weakness (generalized): Secondary | ICD-10-CM

## 2022-05-03 DIAGNOSIS — N3946 Mixed incontinence: Secondary | ICD-10-CM | POA: Diagnosis not present

## 2022-05-12 ENCOUNTER — Encounter: Payer: Self-pay | Admitting: Physical Therapy

## 2022-05-18 ENCOUNTER — Ambulatory Visit: Payer: Commercial Managed Care - PPO | Admitting: Obstetrics and Gynecology

## 2022-05-23 NOTE — Therapy (Signed)
OUTPATIENT PHYSICAL THERAPY TREATMENT NOTE   Patient Name: Tonya Yang MRN: [id number] DOB:11-Jul-1961, 61 y.o., female Today's Date: 05/24/2022  PCP: Threasa Heads, MD REFERRING PROVIDER: Nunzio Cobbs, MD  END OF SESSION:   PT End of Session - 05/24/22 1234     Visit Number 3    Date for PT Re-Evaluation 07/22/22    Authorization Type UHC    PT Start Time 1232    PT Stop Time 1312    PT Time Calculation (min) 40 min    Activity Tolerance Patient tolerated treatment well    Behavior During Therapy WFL for tasks assessed/performed              Past Medical History:  Diagnosis Date   Abnormal Pap smear of cervix 1990   Posible cryotherapy.   Anxiety    Cancer ([place]) 03/02/2017   left breast cancer   Fibroids    HSV-1 infection    HTN (hypertension)    Melanoma ([place])    left tricep   Osteopenia    Past Surgical History:  Procedure Laterality Date   BREAST SURGERY  03/02/2017   left lumpectomy--[facility]   MELANOMA EXCISION     left tricep   TRIGGER FINGER RELEASE Right 04/25/2018   right ring finger   Patient Active Problem List   Diagnosis Date Noted   Postmenopausal bleeding 09/08/2018   Recurrent vaginitis 09/08/2018   H/O abnormal cervical Papanicolaou smear 06/20/2018   Acquired trigger finger of right ring finger 03/22/2018   Pain in joint of right shoulder 03/22/2018   Hypercholesterolemia 07/20/2017   Allergy status to anesthetic agent status 03/01/2017   Malignant neoplasm of overlapping sites of left breast in female, estrogen receptor positive ([place]) 01/09/2017   Dysplastic nevus 06/27/2016   Insomnia 01/08/2014   Anxiety 01/08/2014   Rhytides 11/26/2013   Alopecia 07/11/2013   Allergic rhinitis 10/20/2011   Vitamin D deficiency 06/07/2011   Malignant melanoma of skin ([place]) 03/28/2011   Benign neoplasm of skin 02/07/2011    REFERRING DIAG: N39.46 (ICD-10-CM) - Mixed incontinence  THERAPY DIAG:  Unspecified lack of  coordination  Muscle weakness (generalized)  Rationale for Evaluation and Treatment Rehabilitation  PERTINENT HISTORY: Breast cancer  PRECAUTIONS: none  SUBJECTIVE: I measured my morning urine and it was a lot... maybe 2 cups at least.  Only one leakage and small amount.  There was just a quick small release.  PAIN:  Are you having pain? No   OBJECTIVE: (objective measures completed at initial evaluation unless otherwise dated) MUSCLE LENGTH: Hamstrings: Right 70 deg; Left 70 deg     LUMBAR SPECIAL TESTS:  ASLR pos for transversus abdominus weakness        FUNCTIONAL TESTS:  Single leg stand, mild trendelenburg   GAIT:   Comments: slight antalgic Rt   POSTURE:  Increased thoracic kyphosis and rounded shoulders   LUMBARAROM/PROM   A/PROM A/PROM  eval  Flexion    Extension    Right lateral flexion    Left lateral flexion    Right rotation    Left rotation     (Blank rows = not tested)   LOWER EXTREMITY ROM:   Passive ROM Right eval Left eval  Hip flexion      Hip extension      Hip abduction      Hip adduction      Hip internal rotation      Hip external rotation      Knee  flexion      Knee extension      Ankle dorsiflexion      Ankle plantarflexion      Ankle inversion      Ankle eversion       (Blank rows = not tested)   LOWER EXTREMITY MMT:   MMT Right eval Left eval  Hip flexion      Hip extension 5/5 5/5  Hip abduction 4+/5 4/5  Hip adduction 4+/5 4/5  Hip internal rotation      Hip external rotation      Knee flexion      Knee extension      Ankle dorsiflexion      Ankle plantarflexion      Ankle inversion      Ankle eversion        PELVIC MMT:   MMT eval  Vaginal 3/5; 8 sec; 5 reps slightly longer to relax  Internal Anal Sphincter    External Anal Sphincter    Puborectalis    Diastasis Recti    (Blank rows = not tested)         PALPATION:   General  thoracic and lumbar tight; sacrum stiff bil                 External  Perineal Exam irritation and dryness to labio minora; tight posterior forchette                             Internal Pelvic Floor tight and tender levators and bulbocavernois and ischiocavernosis   TONE: high   PROLAPSE: none   TODAY'S TREATMENT  Treatment:05/24/22 Exercises  Thoracic rotation  Manual Lumbar and thoracic STM Trigger Point Dry-Needling  Treatment instructions: Expect mild to moderate muscle soreness. S/S of pneumothorax if dry needled over a lung field, and to seek immediate medical attention should they occur. Patient verbalized understanding of these instructions and education.  Patient Consent Given: Yes Education handout provided: Previously provided Muscles treated: lumbar and thoracic multifidi Electrical stimulation performed: No Parameters: N/A Treatment response/outcome: increased muscle length and allowed for deeper palpation into fascial and muscle layers   Nuero Re-ed Education and cues for coordination of breathing and pelvic floor muscle contracting and relaxing at appropriate times  Self care Reviewed bladder diary findings - she didn't bring it but could recall bladder fullness Tennis ball massage to glutes and low back  Treatment:05/03/22 Exercises  Down dog in standing at table Happy baby Knees to chest Piriformis stretch Hip rotation  Nuero Re-ed Education and cues for coordination of breathing and pelvic floor muscle contracting and relaxing at appropriate times  Self care Urge techniques and bladder diary    PATIENT EDUCATION:  Education details: Access Code: [email or web address] Person educated: Patient Education method: Explanation, Demonstration, Tactile cues, Verbal cues, and Handouts Education comprehension: verbalized understanding     HOME EXERCISE PROGRAM: Access Code: [email or web address] URL: [email or web address] Date: 05/03/2022 Prepared by: Jari Favre  Exercises - Happy Baby with Pelvic Floor Lengthening   - 1 x daily - 7 x weekly - 1 sets - 3 reps - 30 hold - Cat Cow to Child's Pose  - 1 x daily - 7 x weekly - 1 sets - 5 reps - 10 sec hold - Standing with Forearms Thoracic Rotation  - 1 x daily - 7 x weekly - 3 sets - 10 reps - Supine Diaphragmatic Breathing  - 3 x daily - 7 x weekly -  1 sets - 10 reps - Hooklying Small March  - 1 x daily - 7 x weekly - 2 sets - 10 reps - Supine Hip Internal and External Rotation  - 1 x daily - 7 x weekly - 1 sets - 10 reps - 5 sec hold   ASSESSMENT:   CLINICAL IMPRESSION: Patient is feeling better with leakage and urgency overall.  She is still very tight throughout spine. There are trigger points throughout lumbar and thoracic that were addressed.  Pt given tennis ball massage and reviewed other ex's in HEP to maintain imporved soft tissue length  Pt will benefit from skilled PT to continue to address muscle length.     OBJECTIVE IMPAIRMENTS decreased activity tolerance, decreased coordination, decreased endurance, decreased strength, increased fascial restrictions, increased muscle spasms, impaired tone, postural dysfunction, and pain.    ACTIVITY LIMITATIONS interpersonal relationship, community activity, and exercise, continence .    PERSONAL FACTORS 1 comorbidity: history of breast cancer  are also affecting patient's functional outcome.      REHAB POTENTIAL: Excellent   CLINICAL DECISION MAKING: Evolving/moderate complexity   EVALUATION COMPLEXITY: Moderate     GOALS: Goals reviewed with patient? Yes   SHORT TERM GOALS: Target date: 05/27/2022   Ind with knowledge of urgency and moisturizer handouts Baseline: Goal status: met 05/24/22    2.  Ind with initial stretches Baseline: able to do several times Goal status: met   LONG TERM GOALS: Target date: 07/22/2022    Pt will be independent with advanced HEP to maintain improvements made throughout therapy   Baseline:  Goal status: Ongoing   2.  Pt will report 50% reduction of pain due  to improvements in posture, strength, and muscle length   Baseline: some pain when bending in the inner thigh on right side Goal status: Ongoing   3.  Pt will have reduced bladder frequency to at most every 1 hour due to improved soft tissue health of the pelvic floor            Baseline:  Goal status: Ongoing   4.  Pt will report urgency decreased at least 50% for more time to get to the bathroom Baseline: that is better, I have been controlling and can hold 50% longer Goal status: Ongoing     PLAN: PT FREQUENCY: 1x/week   PT DURATION: 12 weeks   PLANNED INTERVENTIONS: Therapeutic exercises, Therapeutic activity, Neuromuscular re-education, Balance training, Gait training, Patient/Family education, Joint mobilization, Dry Needling, Electrical stimulation, Cryotherapy, Moist heat, Taping, Biofeedback, Manual therapy, and Re-evaluation   PLAN FOR NEXT SESSION: f/u on DN#1 and do #2 next, adductors stretches, review dilator and give info on where to get and sizes for pelvic exams  Jule Ser, PT 05/24/2022, 12:38 PM

## 2022-05-24 ENCOUNTER — Ambulatory Visit: Payer: Commercial Managed Care - PPO | Attending: Obstetrics and Gynecology | Admitting: Physical Therapy

## 2022-05-24 ENCOUNTER — Encounter: Payer: Self-pay | Admitting: Physical Therapy

## 2022-05-24 DIAGNOSIS — R279 Unspecified lack of coordination: Secondary | ICD-10-CM | POA: Diagnosis present

## 2022-05-24 DIAGNOSIS — N3946 Mixed incontinence: Secondary | ICD-10-CM | POA: Diagnosis present

## 2022-05-24 DIAGNOSIS — M6281 Muscle weakness (generalized): Secondary | ICD-10-CM | POA: Diagnosis present

## 2022-05-31 ENCOUNTER — Encounter: Payer: Commercial Managed Care - PPO | Admitting: Physical Therapy

## 2022-06-07 ENCOUNTER — Encounter: Payer: Self-pay | Admitting: Physical Therapy

## 2022-06-07 ENCOUNTER — Ambulatory Visit: Payer: Commercial Managed Care - PPO | Admitting: Physical Therapy

## 2022-06-07 DIAGNOSIS — R279 Unspecified lack of coordination: Secondary | ICD-10-CM

## 2022-06-07 DIAGNOSIS — M6281 Muscle weakness (generalized): Secondary | ICD-10-CM

## 2022-06-07 DIAGNOSIS — N3946 Mixed incontinence: Secondary | ICD-10-CM | POA: Diagnosis not present

## 2022-06-07 NOTE — Therapy (Addendum)
OUTPATIENT PHYSICAL THERAPY TREATMENT NOTE   Patient Name: Tonya Yang MRN: [id number] DOB:September 17, 1961, 61 y.o., female Today's Date: 06/07/2022  PCP: Tonya Heads, MD REFERRING PROVIDER: Nunzio Cobbs, MD  END OF SESSION:   PT End of Session - 06/07/22 1240     Visit Number 4    Date for PT Re-Evaluation 07/22/22    Authorization Type UHC    PT Start Time 2633    PT Stop Time 1315    PT Time Calculation (min) 40 min    Activity Tolerance Patient tolerated treatment well    Behavior During Therapy WFL for tasks assessed/performed               Past Medical History:  Diagnosis Date   Abnormal Pap smear of cervix 1990   Posible cryotherapy.   Anxiety    Cancer ([place]) 03/02/2017   left breast cancer   Fibroids    HSV-1 infection    HTN (hypertension)    Melanoma ([place])    left tricep   Osteopenia    Past Surgical History:  Procedure Laterality Date   BREAST SURGERY  03/02/2017   left lumpectomy--[facility]   MELANOMA EXCISION     left tricep   TRIGGER FINGER RELEASE Right 04/25/2018   right ring finger   Patient Active Problem List   Diagnosis Date Noted   Postmenopausal bleeding 09/08/2018   Recurrent vaginitis 09/08/2018   H/O abnormal cervical Papanicolaou smear 06/20/2018   Acquired trigger finger of right ring finger 03/22/2018   Pain in joint of right shoulder 03/22/2018   Hypercholesterolemia 07/20/2017   Allergy status to anesthetic agent status 03/01/2017   Malignant neoplasm of overlapping sites of left breast in female, estrogen receptor positive ([place]) 01/09/2017   Dysplastic nevus 06/27/2016   Insomnia 01/08/2014   Anxiety 01/08/2014   Rhytides 11/26/2013   Alopecia 07/11/2013   Allergic rhinitis 10/20/2011   Vitamin D deficiency 06/07/2011   Malignant melanoma of skin ([place]) 03/28/2011   Benign neoplasm of skin 02/07/2011    REFERRING DIAG: N39.46 (ICD-10-CM) - Mixed incontinence  THERAPY DIAG:  Unspecified lack of  coordination  Muscle weakness (generalized)  Rationale for Evaluation and Treatment Rehabilitation  PERTINENT HISTORY: Breast cancer  PRECAUTIONS: none  SUBJECTIVE: I felt better after last time.  I still have pain across the low back and into my hips.  Not all the time but it happens frequently.    PAIN:  Are you having pain? No   OBJECTIVE: (objective measures completed at initial evaluation unless otherwise dated) MUSCLE LENGTH: Hamstrings: Right 70 deg; Left 70 deg     LUMBAR SPECIAL TESTS:  ASLR pos for transversus abdominus weakness        FUNCTIONAL TESTS:  Single leg stand, mild trendelenburg   GAIT:   Comments: slight antalgic Rt   POSTURE:  Increased thoracic kyphosis and rounded shoulders   LUMBARAROM/PROM   A/PROM A/PROM  eval  Flexion    Extension    Right lateral flexion    Left lateral flexion    Right rotation    Left rotation     (Blank rows = not tested)   LOWER EXTREMITY ROM:   Passive ROM Right eval Left eval  Hip flexion      Hip extension      Hip abduction      Hip adduction      Hip internal rotation      Hip external rotation  Knee flexion      Knee extension      Ankle dorsiflexion      Ankle plantarflexion      Ankle inversion      Ankle eversion       (Blank rows = not tested)   LOWER EXTREMITY MMT:   MMT Right eval Left eval  Hip flexion      Hip extension 5/5 5/5  Hip abduction 4+/5 4/5  Hip adduction 4+/5 4/5  Hip internal rotation      Hip external rotation      Knee flexion      Knee extension      Ankle dorsiflexion      Ankle plantarflexion      Ankle inversion      Ankle eversion        PELVIC MMT:   MMT eval  Vaginal 3/5; 8 sec; 5 reps slightly longer to relax  Internal Anal Sphincter    External Anal Sphincter    Puborectalis    Diastasis Recti    (Blank rows = not tested)         PALPATION:   General  thoracic and lumbar tight; sacrum stiff bil                 External Perineal  Exam irritation and dryness to labio minora; tight posterior forchette                             Internal Pelvic Floor tight and tender levators and bulbocavernois and ischiocavernosis   TONE: high   PROLAPSE: none   TODAY'S TREATMENT  Treatment:06/07/22 Self care Vaginal trainer handout and info and discussing how to use  Manual Lumbar and thoracic STM Trigger Point Dry-Needling  Treatment instructions: Expect mild to moderate muscle soreness. S/S of pneumothorax if dry needled over a lung field, and to seek immediate medical attention should they occur. Patient verbalized understanding of these instructions and education.  Patient Consent Given: Yes Education handout provided: Previously provided Muscles treated: lumbar and thoracic multifidi Electrical stimulation performed: No Parameters: N/A Treatment response/outcome: increased muscle length and allowed for deeper palpation into fascial and muscle layers  Treatment:05/24/22 Exercises  Thoracic rotation  Manual Lumbar and thoracic STM Trigger Point Dry-Needling  Treatment instructions: Expect mild to moderate muscle soreness. S/S of pneumothorax if dry needled over a lung field, and to seek immediate medical attention should they occur. Patient verbalized understanding of these instructions and education.  Patient Consent Given: Yes Education handout provided: Previously provided Muscles treated: lumbar and thoracic multifidi Electrical stimulation performed: No Parameters: N/A Treatment response/outcome: increased muscle length and allowed for deeper palpation into fascial and muscle layers   Nuero Re-ed Education and cues for coordination of breathing and pelvic floor muscle contracting and relaxing at appropriate times  Self care Reviewed bladder diary findings - she didn't bring it but could recall bladder fullness Tennis ball massage to glutes and low back  Treatment:05/03/22 Exercises  Down dog in  standing at table Happy baby Knees to chest Piriformis stretch Hip rotation  Nuero Re-ed Education and cues for coordination of breathing and pelvic floor muscle contracting and relaxing at appropriate times  Self care Urge techniques and bladder diary    PATIENT EDUCATION:  Education details: Access Code: [email or web address] Person educated: Patient Education method: Explanation, Demonstration, Tactile cues, Verbal cues, and Handouts Education comprehension: verbalized understanding     HOME EXERCISE PROGRAM:  Access Code: [email or web address] URL: [email or web address] Date: 05/03/2022 Prepared by: Jari Favre  Exercises - Happy Baby with Pelvic Floor Lengthening  - 1 x daily - 7 x weekly - 1 sets - 3 reps - 30 hold - Cat Cow to Child's Pose  - 1 x daily - 7 x weekly - 1 sets - 5 reps - 10 sec hold - Standing with Forearms Thoracic Rotation  - 1 x daily - 7 x weekly - 3 sets - 10 reps - Supine Diaphragmatic Breathing  - 3 x daily - 7 x weekly - 1 sets - 10 reps - Hooklying Small March  - 1 x daily - 7 x weekly - 2 sets - 10 reps - Supine Hip Internal and External Rotation  - 1 x daily - 7 x weekly - 1 sets - 10 reps - 5 sec hold   ASSESSMENT:   CLINICAL IMPRESSION: Patient is feeling better with leakage and urgency overall and dry needling Helped last visit.  She is still very tight throughout spine. There are trigger points throughout lumbar and thoracic that were addressed with dry needling #2 today.  Pt was educated on using dilators.  Pt will benefit from skilled PT to continue to address muscle length.     OBJECTIVE IMPAIRMENTS decreased activity tolerance, decreased coordination, decreased endurance, decreased strength, increased fascial restrictions, increased muscle spasms, impaired tone, postural dysfunction, and pain.    ACTIVITY LIMITATIONS interpersonal relationship, community activity, and exercise, continence .    PERSONAL FACTORS 1 comorbidity: history of  breast cancer  are also affecting patient's functional outcome.      REHAB POTENTIAL: Excellent   CLINICAL DECISION MAKING: Evolving/moderate complexity   EVALUATION COMPLEXITY: Moderate     GOALS: Goals reviewed with patient? Yes   SHORT TERM GOALS: Target date: 05/27/2022   Ind with knowledge of urgency and moisturizer handouts Baseline: Goal status: met 05/24/22    2.  Ind with initial stretches Baseline: able to do several times Goal status: met   LONG TERM GOALS: Target date: 07/22/2022    Pt will be independent with advanced HEP to maintain improvements made throughout therapy   Baseline: 06/07/22  Goal status: Ongoing   2.  Pt will report 50% reduction of pain due to improvements in posture, strength, and muscle length   Baseline: low back into hips 06/07/22  Goal status: Ongoing   3.  Pt will have reduced bladder frequency to at most every 1 hour due to improved soft tissue health of the pelvic floor            Baseline: improving - seems correlated to coffee 06/07/22  Goal status: Ongoing   4.  Pt will report urgency decreased at least 50% for more time to get to the bathroom Baseline: that is better, I have been controlling and can hold 50% longer Goal status: Ongoing     PLAN: PT FREQUENCY: 1x/week   PT DURATION: 12 weeks   PLANNED INTERVENTIONS: Therapeutic exercises, Therapeutic activity, Neuromuscular re-education, Balance training, Gait training, Patient/Family education, Joint mobilization, Dry Needling, Electrical stimulation, Cryotherapy, Moist heat, Taping, Biofeedback, Manual therapy, and Re-evaluation   PLAN FOR NEXT SESSION: f/u on DN#2 and do #3 next, adductors stretches, review dilator and internal STM to re-assess pelvic tension and if can start doing some strengthening  [employer], PT 06/07/2022, 12:41 PM   PHYSICAL THERAPY DISCHARGE SUMMARY  Visits from Start of Care: 4  Current functional level related to goals /  functional  outcomes:   See above goals Remaining deficits:   See above Education / Equipment: HEP   Patient agrees to discharge. Patient goals were not met. Patient is being discharged due to financial reasons.  Gustavus Bryant, PT 07/01/22 11:46 AM

## 2022-06-20 ENCOUNTER — Ambulatory Visit: Payer: Commercial Managed Care - PPO | Admitting: Physical Therapy

## 2022-07-05 ENCOUNTER — Encounter: Payer: Commercial Managed Care - PPO | Admitting: Physical Therapy

## 2022-07-12 ENCOUNTER — Encounter: Payer: Commercial Managed Care - PPO | Admitting: Physical Therapy

## 2022-07-19 ENCOUNTER — Encounter: Payer: Commercial Managed Care - PPO | Admitting: Physical Therapy

## 2022-07-26 ENCOUNTER — Encounter: Payer: Commercial Managed Care - PPO | Admitting: Physical Therapy

## 2022-07-28 ENCOUNTER — Encounter: Payer: Self-pay | Admitting: Obstetrics and Gynecology

## 2022-08-29 NOTE — Progress Notes (Unsigned)
61 y.o. G50P0000 Married Caucasian female here for annual exam.    Using vit E vaginally.  Asking about vaginal estrogen.   Did physical therapy.  Taking Wellbutrin now.   Having night time urination.  Did not take Oxybutynin for urinary frequency.  Still has Rx at home.   Taking Minoxidil for hair loss.  Has some increased hair growth on her body.   PCP:  Gaetano Hawthorne, MD  Patient's last menstrual period was 12/12/2014 (approximate).           Sexually active: No.  The current method of family planning is post menopausal status.    Exercising: Yes.     Walking, strength, 80's aerobics Smoker:  no  Health Maintenance: Pap:  01-21-19 ASCUS:Neg HR HPV, 11-17-17 Neg:Neg HR HPV, 11-12-14 Neg History of abnormal Pap:  yes,  1990 hx possible cryotherapy MMG: 12-17-21 Diag.Bil/Neg/Birads2 Colonoscopy:   05/2017 adenoma polyp.  To see Dr. Collene Mares BMD:  07-19-21  Result :Osteopenia--slight progression.  FRAX:  major fracture risk 13% and hip fracture risk 1.6%. TDaP:  11-19-18 Gardasil:   no HIV: 03-27-17 NR Hep C: 03-27-17 Neg Screening Labs:  PCP and cardiology.   reports that she has never smoked. She has never used smokeless tobacco. She reports that she does not drink alcohol and does not use drugs.  Past Medical History:  Diagnosis Date   Abnormal Pap smear of cervix 1990   Posible cryotherapy.   Anxiety    Cancer ([place]) 03/02/2017   left breast cancer   Fibroids    HSV-1 infection    HTN (hypertension)    Melanoma ([facility])    left tricep   Osteopenia    TMJ (dislocation of temporomandibular joint)     Past Surgical History:  Procedure Laterality Date   BREAST SURGERY  03/02/2017   left lumpectomy--[facility]   MELANOMA EXCISION     left tricep   TRIGGER FINGER RELEASE Right 04/25/2018   right ring finger    Current Outpatient Medications  Medication Sig Dispense Refill   albuterol (VENTOLIN HFA) 108 (90 Base) MCG/ACT inhaler Inhale into the lungs.     buPROPion (WELLBUTRIN  XL) 150 MG 24 hr tablet Take 1 tablet by mouth every morning.     clonazePAM (KLONOPIN) 0.5 MG tablet Take 0.25-0.5 mg by mouth at bedtime.     Diclofenac Sodium (PENNSAID) 2 % SOLN Apply 1 application topically as needed.     hydrOXYzine (ATARAX) 10 MG tablet Take 10 mg by mouth at bedtime as needed.     ketoconazole (NIZORAL) 2 % shampoo Apply topically.     melatonin 5 MG TABS Take by mouth.     minoxidil (LONITEN) 2.5 MG tablet Take 1.25 mg by mouth daily.     nebivolol (BYSTOLIC) 2.5 MG tablet Take 2.5 mg by mouth daily.     VITAMIN D, CHOLECALCIFEROL, PO Take by mouth. 5000 IUdaily     No current facility-administered medications for this visit.    Family History  Problem Relation Age of Onset   Breast cancer Mother        DCIS   Hypertension Mother    Diabetes Mother        borderline   Autoimmune disease Mother        giant cell arteritis   Thyroid disease Mother        hypothyrodism   Hypertension Father    Hypertension Sister    Thyroid disease Sister  hypothyroidism   Hypertension Brother     Review of Systems  All other systems reviewed and are negative.   Exam:   BP 138/74   Pulse 79   Ht '5\' 1"'$  (1.549 m)   Wt 113 lb (51.3 kg)   LMP 12/12/2014 (Approximate)   SpO2 98%   BMI 21.35 kg/m     General appearance: alert, cooperative and appears stated age Head: normocephalic, without obvious abnormality, atraumatic Neck: no adenopathy, supple, symmetrical, trachea midline and thyroid normal to inspection and palpation Lungs: clear to auscultation bilaterally Breasts: normal appearance, no masses or tenderness, No nipple retraction or dimpling, No nipple discharge or bleeding, No axillary adenopathy Heart: regular rate and rhythm Abdomen: soft, non-tender; no masses, no organomegaly Extremities: extremities normal, atraumatic, no cyanosis or edema Skin: skin color, texture, turgor normal. No rashes or lesions Lymph nodes: cervical, supraclavicular, and  axillary nodes normal. Neurologic: grossly normal  Pelvic: External genitalia:  no lesions              No abnormal inguinal nodes palpated.              Urethra:  normal appearing urethra with no masses, tenderness or lesions              Bartholins and Skenes: normal                 Vagina: normal appearing vagina with normal color and discharge, no lesions              Cervix: no lesions              Pap taken: yes Bimanual Exam:  Uterus:  normal size, contour, position, consistency, mobility, non-tender              Adnexa: no mass, fullness, tenderness              Rectal exam: yes.  Confirms.              Anus:  normal sphincter tone, no lesions  Chaperone was present for exam:  Estill Bamberg, CMA  Assessment:   Well woman visit with gynecologic exam. ASCUS pap with negative HR HPV.  Left breast cancer.  Status post lumpectomy and XRT. Off Tamoxifen.  Fibroids.  Atrophy.    Hx HSV I.  Hx melanoma. Nocturia.  Not taking Ditropan XL.  Declines refill.  Osteopenia.   Plan: Mammogram screening discussed. Self breast awareness reviewed. Pap and HR HPV collected. Guidelines for Calcium, Vitamin D, regular exercise program including cardiovascular and weight bearing exercise. Urinalysis:  sg 1.010, ph 5.5, trace blood. Micro: no sig WBC, RBC, bacteria, crystals.  She may choose to do a trial of Ditropan XL.  She may check with her oncologist regarding use of vaginal estrogen cream.  BMD next year.  Follow up annually and prn.   After visit summary provided.

## 2022-08-30 ENCOUNTER — Encounter: Payer: Self-pay | Admitting: Obstetrics and Gynecology

## 2022-08-30 ENCOUNTER — Other Ambulatory Visit (HOSPITAL_COMMUNITY)
Admission: RE | Admit: 2022-08-30 | Discharge: 2022-08-30 | Disposition: A | Discharge status: Home / Self Care | Payer: Commercial Managed Care - PPO | Source: Ambulatory Visit | Attending: Obstetrics and Gynecology | Admitting: Obstetrics and Gynecology

## 2022-08-30 ENCOUNTER — Ambulatory Visit (INDEPENDENT_AMBULATORY_CARE_PROVIDER_SITE_OTHER): Payer: Commercial Managed Care - PPO | Admitting: Obstetrics and Gynecology

## 2022-08-30 VITALS — BP 138/74 | HR 79 | Ht 61.0 in | Wt 113.0 lb

## 2022-08-30 DIAGNOSIS — Z124 Encounter for screening for malignant neoplasm of cervix: Secondary | ICD-10-CM | POA: Diagnosis present

## 2022-08-30 DIAGNOSIS — R351 Nocturia: Secondary | ICD-10-CM

## 2022-08-30 DIAGNOSIS — Z01419 Encounter for gynecological examination (general) (routine) without abnormal findings: Secondary | ICD-10-CM

## 2022-08-30 LAB — URINALYSIS, COMPLETE W/RFL CULTURE
Bacteria, UA: NONE SEEN /HPF
Bilirubin Urine: NEGATIVE
Casts: NONE SEEN /LPF
Crystals: NONE SEEN /HPF
Glucose, UA: NEGATIVE
Hyaline Cast: NONE SEEN /LPF
Ketones, ur: NEGATIVE
Leukocyte Esterase: NEGATIVE
Nitrites, Initial: NEGATIVE
Protein, ur: NEGATIVE
RBC / HPF: NONE SEEN /HPF (ref 0–2)
Specific Gravity, Urine: 1.01 (ref 1.001–1.035)
WBC, UA: NONE SEEN /HPF (ref 0–5)
Yeast: NONE SEEN /HPF
pH: 5.5 (ref 5.0–8.0)

## 2022-08-30 LAB — NO CULTURE INDICATED

## 2022-08-30 NOTE — Addendum Note (Signed)
Addended by: Lowella Fairy on: 08/30/2022 03:00 PM   Modules accepted: Orders

## 2022-08-30 NOTE — Patient Instructions (Addendum)
EXERCISE AND DIET:  We recommended that you start or continue a regular exercise program for good health. Regular exercise means any activity that makes your heart beat faster and makes you sweat.  We recommend exercising at least 30 minutes per day at least 3 days a week, preferably 4 or 5.  We also recommend a diet low in fat and sugar.  Inactivity, poor dietary choices and obesity can cause diabetes, heart attack, stroke, and kidney damage, among others.    ALCOHOL AND SMOKING:  Women should limit their alcohol intake to no more than 7 drinks/beers/glasses of wine (combined, not each!) per week. Moderation of alcohol intake to this level decreases your risk of breast cancer and liver damage. And of course, no recreational drugs are part of a healthy lifestyle.  And absolutely no smoking or even second hand smoke. Most people know smoking can cause heart and lung diseases, but did you know it also contributes to weakening of your bones? Aging of your skin?  Yellowing of your teeth and nails?  CALCIUM AND VITAMIN D:  Adequate intake of calcium and Vitamin D are recommended.  The recommendations for exact amounts of these supplements seem to change often, but generally speaking 600 mg of calcium (either carbonate or citrate) and 800 units of Vitamin D per day seems prudent. Certain women may benefit from higher intake of Vitamin D.  If you are among these women, your doctor will have told you during your visit.    PAP SMEARS:  Pap smears, to check for cervical cancer or precancers,  have traditionally been done yearly, although recent scientific advances have shown that most women can have pap smears less often.  However, every woman still should have a physical exam from her gynecologist every year. It will include a breast check, inspection of the vulva and vagina to check for abnormal growths or skin changes, a visual exam of the cervix, and then an exam to evaluate the size and shape of the uterus and  ovaries.  And after 59 years of age, a rectal exam is indicated to check for rectal cancers. We will also provide age appropriate advice regarding health maintenance, like when you should have certain vaccines, screening for sexually transmitted diseases, bone density testing, colonoscopy, mammograms, etc.   MAMMOGRAMS:  All women over 40 years old should have a yearly mammogram. Many facilities now offer a "3D" mammogram, which may cost around $50 extra out of pocket. If possible,  we recommend you accept the option to have the 3D mammogram performed.  It both reduces the number of women who will be called back for extra views which then turn out to be normal, and it is better than the routine mammogram at detecting truly abnormal areas.    COLONOSCOPY:  Colonoscopy to screen for colon cancer is recommended for all women at age 50.  We know, you hate the idea of the prep.  We agree, BUT, having colon cancer and not knowing it is worse!!  Colon cancer so often starts as a polyp that can be seen and removed at colonscopy, which can quite literally save your life!  And if your first colonoscopy is normal and you have no family history of colon cancer, most women don't have to have it again for 10 years.  Once every ten years, you can do something that may end up saving your life, right?  We will be happy to help you get it scheduled when you are ready.    Be sure to check your insurance coverage so you understand how much it will cost.  It may be covered as a preventative service at no cost, but you should check your particular policy.    Calcium Content in Foods Calcium is the most abundant mineral in the body. Most of the body's calcium supply is stored in bones and teeth. Calcium helps many parts of the body function normally, including: Blood and blood vessels. Nerves. Hormones. Muscles. Bones and teeth. When your calcium stores are low, you may be at risk for low bone mass, bone loss, and broken bones  (fractures). When you get enough calcium, it helps to support strong bones and teeth throughout your life. Calcium is especially important for: Children during growth spurts. Girls during adolescence. Women who are pregnant or breastfeeding. Women after their menstrual cycle stops (postmenopause). Women whose menstrual cycle has stopped due to anorexia nervosa or regular intense exercise. People who cannot eat or digest dairy products. Vegans. Recommended daily amounts of calcium: Women (ages 19 to 50): 1,000 mg per day. Women (ages 51 and older): 1,200 mg per day. Men (ages 19 to 70): 1,000 mg per day. Men (ages 71 and older): 1,200 mg per day. Women (ages 9 to 18): 1,300 mg per day. Men (ages 9 to 18): 1,300 mg per day. General information Eat foods that are high in calcium. Try to get most of your calcium from food. Some people may benefit from taking calcium supplements. Check with your health care provider or diet and nutrition specialist (dietitian) before starting any calcium supplements. Calcium supplements may interact with certain medicines. Too much calcium may cause other health problems, such as constipation and kidney stones. For the body to absorb calcium, it needs vitamin D. Sources of vitamin D include: Skin exposure to direct sunlight. Foods, such as egg yolks, liver, mushrooms, saltwater fish, and fortified milk. Vitamin D supplements. Check with your health care provider or dietitian before starting any vitamin D supplements. What foods are high in calcium?  Foods that are high in calcium contain more than 100 milligrams per serving. Fruits Fortified orange juice or other fruit juice, 300 mg per 8 oz serving. Vegetables Collard greens, 360 mg per 8 oz serving. Kale, 100 mg per 8 oz serving. Bok choy, 160 mg per 8 oz serving. Grains Fortified ready-to-eat cereals, 100 to 1,000 mg per 8 oz serving. Fortified frozen waffles, 200 mg in 2 waffles. Oatmeal, 140 mg in  1 cup. Meats and other proteins Sardines, canned with bones, 325 mg per 3 oz serving. Salmon, canned with bones, 180 mg per 3 oz serving. Canned shrimp, 125 mg per 3 oz serving. Baked beans, 160 mg per 4 oz serving. Tofu, firm, made with calcium sulfate, 253 mg per 4 oz serving. Dairy Yogurt, plain, low-fat, 310 mg per 6 oz serving. Nonfat milk, 300 mg per 8 oz serving. American cheese, 195 mg per 1 oz serving. Cheddar cheese, 205 mg per 1 oz serving. Cottage cheese 2%, 105 mg per 4 oz serving. Fortified soy, rice, or almond milk, 300 mg per 8 oz serving. Mozzarella, part skim, 210 mg per 1 oz serving. The items listed above may not be a complete list of foods high in calcium. Actual amounts of calcium may be different depending on processing. Contact a dietitian for more information. What foods are lower in calcium? Foods that are lower in calcium contain 50 mg or less per serving. Fruits Apple, about 6 mg. Banana, about 12 mg.   Vegetables Lettuce, 19 mg per 2 oz serving. Tomato, about 11 mg. Grains Rice, 4 mg per 6 oz serving. Boiled potatoes, 14 mg per 8 oz serving. White bread, 6 mg per slice. Meats and other proteins Egg, 27 mg per 2 oz serving. Red meat, 7 mg per 4 oz serving. Chicken, 17 mg per 4 oz serving. Fish, cod, or trout, 20 mg per 4 oz serving. Dairy Cream cheese, regular, 14 mg per 1 Tbsp serving. Brie cheese, 50 mg per 1 oz serving. Parmesan cheese, 70 mg per 1 Tbsp serving. The items listed above may not be a complete list of foods lower in calcium. Actual amounts of calcium may be different depending on processing. Contact a dietitian for more information. Summary Calcium is an important mineral in the body because it affects many functions. Getting enough calcium helps support strong bones and teeth throughout your life. Try to get most of your calcium from food. Calcium supplements may interact with certain medicines. Check with your health care provider  or dietitian before starting any calcium supplements. This information is not intended to replace advice given to you by your health care provider. Make sure you discuss any questions you have with your health care provider. Document Revised: 03/25/2020 Document Reviewed: 03/25/2020 Elsevier Patient Education  2023 Elsevier Inc.  

## 2022-08-31 LAB — CYTOLOGY - PAP
Comment: NEGATIVE
Diagnosis: NEGATIVE
High risk HPV: NEGATIVE

## 2023-01-16 ENCOUNTER — Ambulatory Visit (INDEPENDENT_AMBULATORY_CARE_PROVIDER_SITE_OTHER): Payer: Commercial Managed Care - PPO | Admitting: Psychiatry

## 2023-01-16 DIAGNOSIS — Z638 Other specified problems related to primary support group: Secondary | ICD-10-CM

## 2023-01-16 DIAGNOSIS — R52 Pain, unspecified: Secondary | ICD-10-CM | POA: Diagnosis not present

## 2023-01-16 DIAGNOSIS — F458 Other somatoform disorders: Secondary | ICD-10-CM

## 2023-01-16 DIAGNOSIS — Z62820 Parent-biological child conflict: Secondary | ICD-10-CM

## 2023-01-16 DIAGNOSIS — F4323 Adjustment disorder with mixed anxiety and depressed mood: Secondary | ICD-10-CM | POA: Diagnosis not present

## 2023-01-16 DIAGNOSIS — F411 Generalized anxiety disorder: Secondary | ICD-10-CM

## 2023-01-16 DIAGNOSIS — G8929 Other chronic pain: Secondary | ICD-10-CM

## 2023-01-16 NOTE — Progress Notes (Signed)
PROBLEM-FOCUSED INITIAL PSYCHOTHERAPY EVALUATION Luan Moore, PhD LP [name]  Name: Oluwadara Pelon Date: 01/16/2023 Time spent: 60 min MRN: [id number] DOB: [birth year] Guardian/Payee: self  PCP: Jolinda Croak, MD Documentation requested on this visit: No  PROBLEM HISTORY Reason for Visit /Presenting Problem:  Chief Complaint  Patient presents with   Establish Care   Anxiety   Pain    Narrative/History of Present Illness Referred by Internet search for treatment of pain.  Hx of concerns for privacy.  Hx breast cancer.  PT reports interest in therapy now b/c started ear pain a couple years ago, did not respond to antibiotics or steroids.  ENT discerned TMJ, referred her for PT and a mouth guard.  Insurance issues drove her to find the [place] specialty pain clinic for TMJ, which included both PT and a psychologist, but the psychologist was not covered.  Looked up local pain mgmt psychology, found me.  Having other pain from spinal stenosis, confirmed by MRI, recently consulted with a [facility] neurologist in [place], who rated stenosis mild, arthritis worse.  Oriented to nerve ablation as a nonsurgical option, but right now nothing required but medication (PRN Celebrex, about 2 years now) and Cymbalta about 4-6 weeks.  Diclofenac gel also available, plus OTC lidocaine roll on.  Recognizes habit of bruxism, which works her dental surface and TMJs.  PT seen once, has offered exercises, and is beginning with a mouth guard.    Re. psychology, recognizes worry issues, one large one being dilemma with [name] health/welfare.  62yo, alone in [place], [place], and M has long expected Irielle will be the one to pick her up and bring her home when she is unable to self-care.  Didi is middle child, eldest girl, elder brother treated like a prince and allowed to sequester with his FOI, while sister Marlowe Kays tried to live with her and help then ejected but still lives nearby.  Marlowe Kays also  estranged from [place].  History of mother dumping to -- and on -- Drayah, set her up to be the designated helper.  Has also been the most highly educated member of the family, with both parents high school educated.  Longstanding issues with M taking her for granted while making over her siblings, who don't see her much.  2021 traveled with Fenton Malling to see M, 2 wks later got the call M fell, had surgery for a femur break, and Etheline had to take several weeks caring for her.  Mother also has a long tendency to paranoia and manipulation.  Tarnesha reasonably figures her bruxism responds both to fears of having mother's situation drop on her and anger at being expected to pick up.  In addition to bruxism driving TMJ, had insomnia while more actively caring for mother.  PCP offered Lexapro and "doctor's orders" off travel 3 months.  Now on Cymbalta for added pain benefit, early in titration.  Uses magnesium glycinate by Rx to help relax muscles and manage anxiety.  Prior Psychiatric Assessment/Treatment:   Outpatient treatment: yes  Psychiatric hospitalization: none stated Psychological assessment/testing: none stated   Abuse/neglect screening: Victim of abuse: No.   Victim of neglect: Yes emotional.   Perpetrator of abuse/neglect: No.   Witness / Exposure to Domestic Violence: Not assessed at this time / none suspected.   Witness to Community Violence:  Not assessed at this time / none suspected.   Protective Services Involvement: No.   Report needed: No.    Substance abuse screening: Current substance  abuse: Not assessed at this time / none suspected.   History of impactful substance use/abuse: Not assessed at this time / none suspected.     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  Of note for psychotherapy at this time chronic TMJ pain and vulnerable hx cancer treatment. Past Medical History:  Diagnosis Date   Abnormal Pap smear of cervix 1990   Posible cryotherapy.   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    Lanolin Other (See Comments)    Positive patch test Positive patch test   Other Swelling    Pain, swelling - patch test showed reaction to some amide type anesthetics Pain swelling Pain, swelling - patch test showed reaction to some amide type anesthetics Pain swelling Pain, swelling - patch test showed reaction to some amide type anesthetics Pain swelling   Paba Derivatives Swelling    Patch test showed reaction to some ester type anesthetics  Patch test showed reaction to  some ester type anesthetics    Statins Other (See Comments)    arthralgia   Nickel Other (See Comments)    Patch test   Prilocaine Rash   Procaine Rash and Other (See Comments)    Other reaction(s): Blistering skin rash   Tetracaine Rash    Medications (as listed in Epic): Current Outpatient Medications  Medication Sig Dispense Refill   albuterol (VENTOLIN HFA) 108 (90 Base) MCG/ACT inhaler Inhale into the lungs.     buPROPion (WELLBUTRIN XL) 150 MG 24 hr tablet Take 1 tablet by mouth every morning.     clonazePAM (KLONOPIN) 0.5 MG tablet Take 0.25-0.5 mg by mouth at bedtime.     Diclofenac Sodium (PENNSAID) 2 % SOLN Apply 1 application topically as needed.     hydrOXYzine (ATARAX) 10 MG tablet Take 10 mg by mouth at bedtime as needed.     ketoconazole (NIZORAL) 2 % shampoo Apply topically.     melatonin 5 MG TABS Take by mouth.     minoxidil (LONITEN) 2.5 MG tablet Take 1.25 mg by mouth daily.     nebivolol (BYSTOLIC) 2.5 MG tablet Take  2.5 mg by mouth daily.     VITAMIN D, CHOLECALCIFEROL, PO Take by mouth. 5000 IUdaily     No current facility-administered medications for this visit.    MENTAL STATUS AND OBSERVATIONS Appearance:   Casual     Behavior:  Appropriate and Motivated  Motor:  Normal  Speech/Language:   Clear and Coherent  Affect:  Appropriate and energetic  Mood:  anxious and euthymic  Thought process:  normal  Thought content:    WNL  Sensory/Perceptual disturbances:    WNL  Orientation:  Fully oriented  Attention:  Good  Concentration:  Good  Memory:  WNL  Fund of knowledge:   Good  Insight:    Good  Judgment:   Good  Impulse Control:  Good   Initial Risk Assessment: Danger to self: No Self-injurious behavior: No Danger to others: No Physical aggression / violence: No Duty to warn: No Access to firearms a concern: No Gang involvement: No Patient / guardian was educated about steps to take if suicide or homicide risk level increases between visits:  yes While future psychiatric events cannot be accurately predicted, the patient does not currently require acute inpatient psychiatric care and does not currently meet [facility] involuntary commitment criteria.   DIAGNOSIS:    ICD-10-CM   1. Generalized anxiety disorder  F41.1     2. 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 Adjustment disorder with mixed anxiety and depressed mood  F43.23     5. Relationship problem between parent and child  Z62.820     6. Relationship problem with family member  Z63.8       INITIAL TREATMENT: Support/validation provided for distressing symptoms and confirmed rapport Ethical orientation and informed consent confirmed re: privacy rights -- including but not limited to HIPAA, EMR and use of e-PHI patient responsibilities -- scheduling, fair notice of changes, in-person vs. telehealth and regulatory and financial conditions affecting choice expectations for working relationship in psychotherapy needs and consents for working partnerships and exchange of information with other health care providers, especially any medication and other behavioral health providers Initial orientation to cognitive-behavioral and solution-focused therapy approach Psychoeducation and initial recommendations: Validated understandings of psychosomatic factors driving pain.  Interpreted jaw tension traditionally being tied to frustrated anger and effectively experiencing an overflow of tension/anger not sufficiently vented and relieved in other ways. Assured it is reasonable to object to mother's assumptions, the tricky part will be creating and selling her on a plan for more appropriate care Strife with siblings we can consult about communication strategy as needed Ideas today for ways to better manage jaw tension and decondition clenching/grinding habits Outlook for therapy -- scheduling constraints, availability  of crisis service, inclusion of family member(s) as appropriate  Plan: Anti-bruxism Habit reversal -- try tongue-thrusting (out, roof of mouth) as a redirected habit to clenching Option focused muscle relaxation -- relax thoroughly when tempted to clench, imagine tongue very heavy Use relaxing breathing, any method available, to reduce overall tension/anxiety/arousal Mother's care dilemma Either alone or with family help, research forms, costs,and process for elder care to begin framing the alternative to intolerably taking M in Will address messaging later, but warm up to have the conversation that will be about practicalities, not loyalty, and ready to take heat anyway Maintain medication as prescribed and work faithfully with relevant prescriber(s) if any changes are desired or seem indicated  Call the clinic on-call service, present to ER, or call 911 if any life-threatening psychiatric crisis Return 2-4 wks as able.  Blanchie Serve, PhD  Luan Moore, PhD LP Clinical Psychologist, [name] [address] [place], [place] [address] [phone]

## 2023-01-24 ENCOUNTER — Ambulatory Visit (INDEPENDENT_AMBULATORY_CARE_PROVIDER_SITE_OTHER): Payer: Commercial Managed Care - PPO | Admitting: Psychiatry

## 2023-01-24 DIAGNOSIS — R52 Pain, unspecified: Secondary | ICD-10-CM | POA: Diagnosis not present

## 2023-01-24 DIAGNOSIS — F4323 Adjustment disorder with mixed anxiety and depressed mood: Secondary | ICD-10-CM | POA: Diagnosis not present

## 2023-01-24 DIAGNOSIS — G8929 Other chronic pain: Secondary | ICD-10-CM

## 2023-01-24 DIAGNOSIS — F411 Generalized anxiety disorder: Secondary | ICD-10-CM | POA: Diagnosis not present

## 2023-01-24 DIAGNOSIS — F458 Other somatoform disorders: Secondary | ICD-10-CM | POA: Diagnosis not present

## 2023-01-24 DIAGNOSIS — Z62820 Parent-biological child conflict: Secondary | ICD-10-CM

## 2023-01-24 DIAGNOSIS — Z638 Other specified problems related to primary support group: Secondary | ICD-10-CM

## 2023-01-24 NOTE — Progress Notes (Addendum)
Psychotherapy Progress Note [name] Luan Moore, PhD LP  Patient ID: Tonya Yang [facility])    MRN: [id number] Therapy format: Individual psychotherapy Date: 01/24/2023      Start: 3:15p     Stop: 4:05p     Time Spent: 50 min Location: In-person   Session narrative (presenting needs, interim history, self-report of stressors and symptoms, applications of prior therapy, status changes, and interventions made in session) Really found it helpful to intentionally do something else with her tongue to redirect jaw tension.  Aware of grimacing, even, before she goes to sleep, working the change.  Aware that just thinking of her mom concerns triggers it.  Is also practicing breathing more actively -- inhale, hold 1 sec, release, and gradually add counts to the hold.  Affirmed progress and oriented to stretching the exhale instead as a more accurate way of deescalating ANS arousal, and to the value of diaphragmatic breathing and muscular release in other places.  Recent, unsettling experience of transposing numbers while calling an airline, and finding the person who answered actually posed as the intended recipient, taking credit card info, email, and her husband's name, in a "found" effort to pirate her credit/identity.  Understandably found her jaw tension and TMJ popping flared a lot in response.  Confirmed she had taken all reasonable steps to control risk, validated frustration, and encouraged to acknowledge for herself that she has done what it needs but if she still hates it, to readily acknowledge and self-affirm.  Probed other issues that tend to get her frustrated and trigger tension: variety of things, including when people show poor manners, self-centered behavior of many kinds, impulsive and texting drivers, mother's age, safety issues (80+, one eye blind, and driving).     Family hx, reveals she has been estranged by her father and both siblings, actually, and only  her mother speaks to her.  Some time back, M had giant-cell arteritis, which caused her left eye blindness for going misdiagnosed long enough to have permanent damage..    Re. the threat of being pressured to live with mother, she may have some comfort in mom having burned out on a living situation with sister Marlowe Kays and her Barbaraann Barthel.  Agreed it could be a persuasive argument for not trying to cohabitate themselves.  Also some inspiration from a friend of [name], professing she wants to be independent as long as possible.  Agreed that may help prop up any illusions of continuing to live independently, but she will still need to form a plan and practical reasons for situating herself other than in Fallan's home, and Danajia will still have to become ready to say no and weather backlash.  As a way of preparing, proposed she start by thinking of the three worst ways she could broach the subject.  Therapeutic modalities: Cognitive Behavioral Therapy, Solution-Oriented/Positive Psychology, and Ego-Supportive  Mental Status/Observations:  Appearance:   Casual     Behavior:  Appropriate  Motor:  Normal  Speech/Language:   Clear and Coherent  Affect:  Appropriate  Mood:  anxious and irritable with subject  Thought process:  normal  Thought content:    WNL  Sensory/Perceptual disturbances:    WNL  Orientation:  Fully oriented  Attention:  Good    Concentration:  Good  Memory:  WNL  Insight:    Good  Judgment:   Good  Impulse Control:  Good   Risk Assessment: Danger to Self: No Self-injurious Behavior: No Danger to Others:  No Physical Aggression / Violence: No Duty to Warn: No Access to Firearms a concern: No  Assessment of progress:  progressing  Diagnosis:   ICD-10-CM   1. Generalized anxiety disorder  F41.1     2. 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 Adjustment disorder with mixed anxiety and depressed mood  F43.23     5.  Relationship problem between parent and child  Z62.820     6. Relationship problem with family member  Z63.8      Plan:  Anti-bruxism Habit reversal -- try tongue-thrusting (out, roof of mouth) as a redirected habit to clenching Option focused muscle relaxation -- relax thoroughly when tempted to clench, imagine tongue very heavy Anxiety/anger management Use relaxing breathing, any method available, to reduce overall tension/anxiety/arousal Practice acknowledgment when frustrated -- recognize when body is trying to say it for her and acknowledge verbally or mentally instead Self-affirm when reasonable actions have been taken and it's time to let others respond Mother's care dilemma Either alone or with family help, research forms, costs,and process for elder care to begin framing the alternative to intolerably taking M in Brainstorm ways to tell M no -- to legitimize her position and detoxify disappointing her, may try imagining the worst ways to tell her, not just "good" ones Other recommendations/advice as may be noted above Continue to utilize previously learned skills ad lib Maintain medication as prescribed and work faithfully with relevant prescriber(s) if any changes are desired or seem indicated Call the clinic on-call service, 988/hotline, 911, or present to [facility] or ER if any life-threatening psychiatric crisis Return for put on CA list. Already scheduled visit in this office 03/15/2023.  Blanchie Serve, PhD Luan Moore, PhD LP Clinical Psychologist, [name] [address] [place], [place] [address] [phone]

## 2023-02-15 ENCOUNTER — Ambulatory Visit (INDEPENDENT_AMBULATORY_CARE_PROVIDER_SITE_OTHER): Payer: Commercial Managed Care - PPO | Admitting: Psychiatry

## 2023-02-15 DIAGNOSIS — F458 Other somatoform disorders: Secondary | ICD-10-CM

## 2023-02-15 DIAGNOSIS — F411 Generalized anxiety disorder: Secondary | ICD-10-CM | POA: Diagnosis not present

## 2023-02-15 DIAGNOSIS — F4323 Adjustment disorder with mixed anxiety and depressed mood: Secondary | ICD-10-CM

## 2023-02-15 DIAGNOSIS — Z638 Other specified problems related to primary support group: Secondary | ICD-10-CM

## 2023-02-15 DIAGNOSIS — R52 Pain, unspecified: Secondary | ICD-10-CM

## 2023-02-15 DIAGNOSIS — Z62820 Parent-biological child conflict: Secondary | ICD-10-CM

## 2023-02-15 DIAGNOSIS — G8929 Other chronic pain: Secondary | ICD-10-CM

## 2023-02-15 NOTE — Progress Notes (Signed)
Psychotherapy Progress Note [name] Luan Moore, PhD LP  Patient ID: Tonya Yang [facility])    MRN: [id number] Therapy format: Individual psychotherapy Date: 02/15/2023      Start: 9:13a     Stop: 10:00a     Time Spent: 47 min Location: In-person   Session narrative (presenting needs, interim history, self-report of stressors and symptoms, applications of prior therapy, status changes, and interventions made in session) Thought about homework idea (3 worst ways to tell M she won't be agreeing to take her in).  Didn't really script anything, but did have a conversation that involved something closer to assertiveness, concerning future planning and presumed carte-blanche to depend on her issue.    Misusing behavioral psychology principles, B Delfino Lovett has had the simplistic idea that she could just "extinguish" [name] pestering and neediness by not answering, while he has indulged in the same presumptuousness M has, promising things on Jameya's behalf.  Discussed the sytem from a behavior mod perspective, noting that differential reinforcement of other behavior (e.g., better listening when approached more constructively) and stating means-ends relationships (if you __, I can __ better) is probably much more what's needed if she is going to shape any behavior.  Further hx of Remy writing M to set a boundary and M writing back to disown her, c 25 yrs ago, albeit while M was in divorce and probably menopause, then going silent for a year or so.  Validated she can make it costly and that hopefully Felica is growing in her stomach for unreasonable reactions.    In other hx, it has been full silence with both Sherolyn Buba and BIL Jonni Sanger since a blowout Christmas '22.  Jonni Sanger broke silence recently, specifically to get back in touch with Shanon Brow, but signs were that it was [employer]; the optics of it seem to be trying to draw in Rimini while still [employer], and the impression has  seemingly lit up Jillia intensely about being shunned.  Even though she doesn't want a relationship, she reacted to a few days of friendly "bro" texts made known to her and group texted her objection.  Processed feelings and intentions, reckoning enough said, and that she may need to get her own wishes clear how she wants it to work out.  Acknowledged she doesn't feel fully supported by her own husband, either.  Validated risk of feeling too alone with all things an the value of still keeping her own tact and making sure she doesn't burden herself further by contributing to any impression of herself as unreasonable.  Therapeutic modalities: Cognitive Behavioral Therapy, Solution-Oriented/Positive Psychology, Ego-Supportive, and Assertiveness/Communication  Mental Status/Observations:  Appearance:   Casual     Behavior:  Agitated  Motor:  Normal  Speech/Language:   Clear and Coherent  Affect:  Appropriate  Mood:  Irritated with subject  Thought process:  normal  Thought content:    Some obsessing  Sensory/Perceptual disturbances:    WNL  Orientation:  Fully oriented  Attention:  Good    Concentration:  Good  Memory:  WNL  Insight:    Good  Judgment:   Good  Impulse Control:  Variable   Risk Assessment: Danger to Self: No Self-injurious Behavior: No Danger to Others: No Physical Aggression / Violence: No Duty to Warn: No Access to Firearms a concern: No  Assessment of progress:  progressing  Diagnosis:   ICD-10-CM   1. Generalized anxiety disorder  F41.1     2. Relationship problem with  family member  Z63.8     3. Relationship problem between parent and child  Z62.820     4. Adjustment disorder with mixed anxiety and depressed mood  F43.23     5. 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      Plan:  Anti-bruxism Habit reversal -- try tongue-thrusting (out, roof of mouth) as a redirected habit to clenching Option focused  muscle relaxation -- relax thoroughly when tempted to clench, imagine tongue very heavy Anxiety/anger management Use relaxing breathing, any method available, to reduce overall tension/anxiety/arousal Practice acknowledgment when frustrated -- recognize when body is trying to say it for her and acknowledge verbally or mentally instead Self-affirm when reasonable actions have been taken and it's time to let others respond When tempted to outrage with family, try to clarify wishes before responding Mother's care dilemma Either alone or with family help, research forms, costs,and process for elder care to begin framing the alternative to intolerably taking M in Brainstorm ways to tell M no -- to legitimize her position and detoxify disappointing her, may try imagining the worst ways to tell her, not just "good" ones Other recommendations/advice as may be noted above Continue to utilize previously learned skills ad lib Maintain medication as prescribed and work faithfully with relevant prescriber(s) if any changes are desired or seem indicated Call the clinic on-call service, 988/hotline, 911, or present to [facility] or ER if any life-threatening psychiatric crisis No follow-ups on file. Already scheduled visit in this office 03/07/2023.  Blanchie Serve, PhD Luan Moore, PhD LP Clinical Psychologist, [name] [address] [place],  [address] [phone]

## 2023-03-07 ENCOUNTER — Ambulatory Visit (INDEPENDENT_AMBULATORY_CARE_PROVIDER_SITE_OTHER): Payer: Commercial Managed Care - PPO | Admitting: Psychiatry

## 2023-03-07 DIAGNOSIS — Z638 Other specified problems related to primary support group: Secondary | ICD-10-CM | POA: Diagnosis not present

## 2023-03-07 DIAGNOSIS — F411 Generalized anxiety disorder: Secondary | ICD-10-CM | POA: Diagnosis not present

## 2023-03-07 DIAGNOSIS — G8929 Other chronic pain: Secondary | ICD-10-CM

## 2023-03-07 DIAGNOSIS — Z62820 Parent-biological child conflict: Secondary | ICD-10-CM

## 2023-03-07 DIAGNOSIS — F4323 Adjustment disorder with mixed anxiety and depressed mood: Secondary | ICD-10-CM | POA: Diagnosis not present

## 2023-03-07 NOTE — Progress Notes (Signed)
Psychotherapy Progress Note [name] [name], PhD LP  Patient ID: Tonya Yang [facility])    MRN: [id number] Therapy format: Individual psychotherapy Date: 03/07/2023      Start: 2:06p     Stop: 2:55p     Time Spent: 49 min Location: In-person   Session narrative (presenting needs, interim history, self-report of stressors and symptoms, applications of prior therapy, status changes, and interventions made in session) Continues to feel better after calling off S and BIL.  Has noticed resting HRs > 100 appeared without her knowing she was affected, then after she dealt with S Tonya Yang's manipulation, it disappeared, suggesting it had most to do with resolving the issue.    Discussed guilt/regret, felt almost her entire life, though curiously not with calling out her sister the way she did.  Discussed more issues with mother -- her martyring, lying, exaggerating pain for attention -- and Tonya Yang feeling hooked into helping her over time, and unexpressed demands to have Tonya Yang hear her out when she tries to help/explain.  Clarified perspective on mother and encouraged to release unacknowledged demands for responsiveness.  Therapeutic modalities: Cognitive Behavioral Therapy, Solution-Oriented/Positive Psychology, and Ego-Supportive  Mental Status/Observations:  Appearance:   Casual     Behavior:  Appropriate  Motor:  Normal  Speech/Language:   Clear and Coherent  Affect:  Appropriate  Mood:  dysthymic, anxious  Thought process:  normal  Thought content:    Some rumination  Sensory/Perceptual disturbances:    WNL  Orientation:  Fully oriented  Attention:  Good    Concentration:  Good  Memory:  WNL  Insight:    Good  Judgment:   Good  Impulse Control:  Good   Risk Assessment: Danger to Self: No Self-injurious Behavior: No Danger to Others: No Physical Aggression / Violence: No Duty to Warn: No Access to Firearms a concern: No  Assessment of progress:   progressing  Diagnosis:   ICD-10-CM   1. Generalized anxiety disorder  F41.1     2. Relationship problem with family member  Z63.8     3. Relationship problem between parent and child  Z62.820     4. Adjustment disorder with mixed anxiety and depressed mood  F43.23     5. 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    G89.29      Plan:  Anti-bruxism Habit reversal -- try tongue-thrusting (out, roof of mouth) as a redirected habit to clenching Option focused muscle relaxation -- relax thoroughly when tempted to clench, imagine tongue very heavy Anxiety/anger management Use relaxing breathing, any method available, to reduce overall tension/anxiety/arousal Practice acknowledgment when frustrated -- recognize when body is trying to say it for her and acknowledge verbally or mentally instead Try to allow for physical reactions to stress, trust them to return to normal after work the problem Self-affirm when reasonable actions have been taken and it's time to let others respond When tempted to outrage with family, try to clarify wishes before responding Mother's care dilemma Either alone or with family help, research forms, costs,and process for elder care to begin framing the alternative to intolerably taking Tonya Yang in Brainstorm ways to tell Tonya Yang no -- to legitimize her position and detoxify disappointing her, may try imagining the worst ways to tell her, not just "good" ones Family conflict Note tendency to dwell on what's wrong/unfair about others' behavior and work toward setting her own limits more than calling it out in blame Try to limit telling  people off, focus on refusing unfair requirements and asserting her wishes without framing them as demands or "should haves" Other recommendations/advice as may be noted above Continue to utilize previously learned skills ad lib Maintain medication as prescribed and work faithfully with relevant prescriber(s) if any changes are  desired or seem indicated Call the clinic on-call service, 988/hotline, 911, or present to [facility] or ER if any life-threatening psychiatric crisis Return for time as already scheduled. Already scheduled visit in this office 03/15/2023.  Robley Fries, PhD [name], PhD LP Clinical Psychologist, [name] [address] [place], [place] [address] [phone]

## 2023-03-15 ENCOUNTER — Ambulatory Visit: Payer: Commercial Managed Care - PPO | Admitting: Psychiatry

## 2023-03-24 ENCOUNTER — Ambulatory Visit (INDEPENDENT_AMBULATORY_CARE_PROVIDER_SITE_OTHER): Payer: Commercial Managed Care - PPO | Admitting: Psychiatry

## 2023-03-24 DIAGNOSIS — F4323 Adjustment disorder with mixed anxiety and depressed mood: Secondary | ICD-10-CM

## 2023-03-24 DIAGNOSIS — G8929 Other chronic pain: Secondary | ICD-10-CM

## 2023-03-24 DIAGNOSIS — Z62820 Parent-biological child conflict: Secondary | ICD-10-CM

## 2023-03-24 DIAGNOSIS — Z638 Other specified problems related to primary support group: Secondary | ICD-10-CM | POA: Diagnosis not present

## 2023-03-24 DIAGNOSIS — R52 Pain, unspecified: Secondary | ICD-10-CM

## 2023-03-24 DIAGNOSIS — F411 Generalized anxiety disorder: Secondary | ICD-10-CM

## 2023-03-24 NOTE — Progress Notes (Signed)
Psychotherapy Progress Note [name] [name], PhD LP  Patient ID: Tonya Yang [facility])    MRN: [id number] Therapy format: Individual psychotherapy Date: 03/24/2023      Start: 11:18a     Stop: 12:08p     Time Spent: 50 min Location: In-person   Session narrative (presenting needs, interim history, self-report of stressors and symptoms, applications of prior therapy, status changes, and interventions made in session) 31st anniversary recently, mixed feelings around it.  Relationship with [name] hinted can be painful and lonely sometimes.  Still warming up to the issue of breaking it to her mother that she won't be taking her into the house at such time as she needs in-home care.  Explored rationales for declining and rehearsed a bit making those points, taking [name] reactions.  Cont to advocate for releasing unacknowledged demands for M to be reasonable and listening well.  No further c/o siblings or escalated HR.  Therapeutic modalities: Cognitive Behavioral Therapy, Solution-Oriented/Positive Psychology, Ego-Supportive, and Assertiveness/Communication  Mental Status/Observations:  Appearance:   Casual     Behavior:  Appropriate  Motor:  Normal  Speech/Language:   Clear and Coherent  Affect:  Appropriate  Mood:  anxious  Thought process:  normal  Thought content:    WNL  Sensory/Perceptual disturbances:    WNL  Orientation:  Fully oriented  Attention:  Good    Concentration:  Good  Memory:  WNL  Insight:    Good  Judgment:   Good  Impulse Control:  Good   Risk Assessment: Danger to Self: No Self-injurious Behavior: No Danger to Others: No Physical Aggression / Violence: No Duty to Warn: No Access to Firearms a concern: No  Assessment of progress:  progressing  Diagnosis:   ICD-10-CM   1. Generalized anxiety disorder  F41.1     2. Relationship problem with family member  Z63.8     3. Relationship problem between parent and child  Z62.820      4. Adjustment disorder with mixed anxiety and depressed mood  F43.23     5. 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  Option to break tension by imagining the worst ways to tell her, not just "good" ones. Family conflict Note tendency to dwell on what's wrong/unfair about others' behavior and work toward setting her own limits more than calling it out in blame Try to limit telling people off, focus on refusing unfair requirements and asserting her wishes without framing them as demands or "should haves" Anxiety/anger management Use relaxing breathing, any method available, to reduce overall tension/anxiety/arousal Practice acknowledgment when frustrated -- recognize when body is trying to say it for her and acknowledge verbally or mentally instead Try to allow for physical reactions to stress, trust them to return to normal after work the problem Self-affirm when reasonable actions have been taken and it's time to let others respond When tempted to outrage with family, try to clarify wishes before responding Anti-bruxism Habit reversal -- try tongue-thrusting (out, roof of mouth) as a redirected habit to clenching Option focused muscle relaxation -- relax thoroughly when tempted to clench, imagine tongue very heavy Other recommendations/advice as may be noted above Continue to utilize previously learned skills ad lib Maintain medication  as prescribed and work faithfully with relevant prescriber(s) if any changes are desired or seem indicated Call the clinic on-call service, 988/hotline, 911, or present to [facility] or ER if any life-threatening psychiatric crisis No follow-ups on file. Already scheduled visit in this office 04/07/2023.  Robley Fries,  PhD [name], PhD LP Clinical Psychologist, [name] [address], [address] [place], [place] [address] [phone]

## 2023-04-07 ENCOUNTER — Ambulatory Visit (INDEPENDENT_AMBULATORY_CARE_PROVIDER_SITE_OTHER): Payer: Commercial Managed Care - PPO | Admitting: Psychiatry

## 2023-04-07 DIAGNOSIS — F411 Generalized anxiety disorder: Secondary | ICD-10-CM | POA: Diagnosis not present

## 2023-04-07 DIAGNOSIS — R52 Pain, unspecified: Secondary | ICD-10-CM | POA: Diagnosis not present

## 2023-04-07 DIAGNOSIS — Z62820 Parent-biological child conflict: Secondary | ICD-10-CM | POA: Diagnosis not present

## 2023-04-07 DIAGNOSIS — F4323 Adjustment disorder with mixed anxiety and depressed mood: Secondary | ICD-10-CM

## 2023-04-07 DIAGNOSIS — G8929 Other chronic pain: Secondary | ICD-10-CM

## 2023-04-07 NOTE — Progress Notes (Signed)
Psychotherapy Progress Note [name] Tonya Czar, PhD LP  Patient ID: Tonya Yang [facility])    MRN: [id number] Therapy format: Individual psychotherapy Date: 04/07/2023      Start: 2:14p     Stop: 3:03p     Time Spent: 49 min Location: In-person   Session narrative (presenting needs, interim history, self-report of stressors and symptoms, applications of prior therapy, status changes, and interventions made in session) Tonya Yang seemingly weekly commentary about other people's situations and whether they are care of or not by their kids.  Has set some expectations, at least indirectly, by commenting that she will only be taking care of her own aging directly, and by inviting Tonya Yang again to look at facilities.  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  Hx of having to make emergency visits to local mental health clinic and state [facility], with refusals to stay, and one episode where state psychiatrist declined admission but wrote short-term Rx for Haldol (only about 15 yrs ago), and Tonya Yang had aggressive, oppositional episode on the way home that was only managed Tonya/c Tonya Yang could lay down some law.  Blames Tonya Yang for getting her dx'd bipolar.  Returning to present, has brought up again checking out rehab centers and getting a better recliner in case of physical need, though Tonya Yang tends to just balk that she doesn't want to.  Discussed tactics and supported in working up further discussion.  Therapeutic modalities: Cognitive Behavioral Therapy, Solution-Oriented/Positive Psychology, Faith-sensitive, and Assertiveness/Communication  Mental Status/Observations:  Appearance:   Casual     Behavior:  Appropriate  Motor:  Normal  Speech/Language:   Clear and Coherent  Affect:  Appropriate  Mood:  anxious  Thought process:  normal  Thought content:    WNL   Sensory/Perceptual disturbances:    WNL  Orientation:  Fully oriented  Attention:  Good    Concentration:  Good  Memory:  WNL  Insight:    Good  Judgment:   Good  Impulse Control:  Good   Risk Assessment: Danger to Self: No Self-injurious Behavior: No Danger to Others: No Physical Aggression / Violence: No Duty to Warn: No Access to Firearms a concern: No  Assessment of progress:  progressing  Diagnosis:   ICD-10-CM   1. Generalized anxiety disorder  F41.1     2. Relationship problem between parent and child  Z62.820     3. Adjustment disorder with mixed anxiety and depressed mood  F43.23     4. 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  Option to break tension by imagining the worst ways to tell her, not just "good" ones.  Continue broaching the subject of looking at rational alternatives including the spectrum of care from in-home assistance to facility-based care, including independent living with peers. Family conflict Note tendency to dwell on what's wrong/unfair about others' behavior and work toward setting her own limits more than calling it out in blame Try to limit telling people off, focus on refusing unfair requirements and asserting her wishes without framing them as demands or "should haves" Anxiety/anger management Use relaxing  breathing, any method available, to reduce overall tension/anxiety/arousal Practice acknowledgment when frustrated -- recognize when body is trying to say it for her and acknowledge verbally or mentally instead Try to allow for physical reactions to stress, trust them to return to normal after work the problem Self-affirm when reasonable actions have been taken and it's time to let others respond When  tempted to outrage with family, try to clarify wishes before responding Anti-bruxism Habit reversal -- try tongue-thrusting (out, roof of mouth) as a redirected habit to clenching Option focused muscle relaxation -- relax thoroughly when tempted to clench, imagine tongue very heavy Other recommendations/advice as may be noted above Continue to utilize previously learned skills ad lib Maintain medication as prescribed and work faithfully with relevant prescriber(s) if any changes are desired or seem indicated Call the clinic on-call service, 988/hotline, 911, or present to Tonya Yang or ER if any life-threatening psychiatric crisis Return for time as already scheduled. Already scheduled visit in this office 04/14/2023.  Tonya Fries, PhD Tonya Czar, PhD LP Clinical Psychologist, [name] [address], [address] [place], [place] [address] [phone]

## 2023-04-14 ENCOUNTER — Ambulatory Visit: Payer: Commercial Managed Care - PPO | Admitting: Psychiatry

## 2023-04-14 DIAGNOSIS — G8929 Other chronic pain: Secondary | ICD-10-CM

## 2023-04-14 DIAGNOSIS — F411 Generalized anxiety disorder: Secondary | ICD-10-CM

## 2023-04-14 DIAGNOSIS — Z62820 Parent-biological child conflict: Secondary | ICD-10-CM | POA: Diagnosis not present

## 2023-04-14 DIAGNOSIS — F4323 Adjustment disorder with mixed anxiety and depressed mood: Secondary | ICD-10-CM | POA: Diagnosis not present

## 2023-04-14 NOTE — Progress Notes (Signed)
Psychotherapy Progress Note [name] [name], PhD LP  Patient ID: Tonya Yang [facility])    MRN: [id number] Therapy format: Individual psychotherapy Date: 04/14/2023      Start: 2:20p     Stop: 3:08p     Time Spent: 48 min Location: In-person   Session narrative (presenting needs, interim history, self-report of stressors and symptoms, applications of prior therapy, status changes, and interventions made in session) Continues to talk with M several times a day.  No incidents to speak of, nor provocations about taking her in.  Used to be in the habit of going to [place] 7 hrs, picking M up and bringing her back for 2 wks, then driving her back, or spending 2 wks with her per quarter.  For a while, had brother's airline benefit to fly her, but that benefit expired.  Much history of M having a range of delicate reactions, including motion sickness, and taking offense to any suggestion she transition home.  Hx of M losing a twin brother Christen Bame to a secret illness 10 years ago, who had been a helpful and warm uncle to [place], but estranged from M.  Last time she flew was then.  Over time, has seen M try to be more agreeable, actually, just not nearly enough to have faith that they could live together on an open-ended basis.  Parallel concern that she cries wolf so much about somatic issues that she could have an actual illness, like cardiac amyloidosis (in family).  Currently suspects U Ree Kida is dying, of a known throat cancer, which implies an upcoming need to help M visit him in [place].  Discussed possible travel and handling aversive interactions.  Therapeutic modalities: Cognitive Behavioral Therapy, Solution-Oriented/Positive Psychology, and Ego-Supportive  Mental Status/Observations:  Appearance:   Casual     Behavior:  Appropriate  Motor:  Normal  Speech/Language:   Clear and Coherent  Affect:  Appropriate  Mood:  tense  Thought process:  normal  Thought  content:    WNL  Sensory/Perceptual disturbances:    WNL  Orientation:  Fully oriented  Attention:  Good    Concentration:  Good  Memory:  WNL  Insight:    Good  Judgment:   Good  Impulse Control:  Good   Risk Assessment: Danger to Self: No Self-injurious Behavior: No Danger to Others: No Physical Aggression / Violence: No Duty to Warn: No Access to Firearms a concern: No  Assessment of progress:  progressing  Diagnosis:   ICD-10-CM   1. Generalized anxiety disorder  F41.1     2. Relationship problem between parent and child  Z62.820     3. Adjustment disorder with mixed anxiety and depressed mood  F43.23     4. 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  Option to break tension by imagining the worst ways to tell her, not just "good" ones.  Continue broaching the subject of looking at rational alternatives including the spectrum of care from in-home assistance to facility-based care, including independent living with peers. Family conflict Note tendency to dwell on what's wrong/unfair about others' behavior and work toward setting her own limits more than calling it out in blame Try to limit telling people off, focus  on refusing unfair requirements and asserting her wishes without framing them as demands or "should haves" Anxiety/anger management Use relaxing breathing, any method available, to reduce overall tension/anxiety/arousal Practice acknowledgment when frustrated -- recognize when body is trying to say it for her and acknowledge verbally or mentally instead Try to allow for physical reactions to stress, trust them to return to normal after work the problem Self-affirm when reasonable actions have been taken and it's time to let others  respond When tempted to outrage with family, try to clarify wishes before responding Anti-bruxism Habit reversal -- try tongue-thrusting (out, roof of mouth) as a redirected habit to clenching Option focused muscle relaxation -- relax thoroughly when tempted to clench, imagine tongue very heavy Other recommendations/advice as may be noted above Continue to utilize previously learned skills ad lib Maintain medication as prescribed and work faithfully with relevant prescriber(s) if any changes are desired or seem indicated Call the clinic on-call service, 988/hotline, 911, or present to [facility] or ER if any life-threatening psychiatric crisis Return for time as already scheduled. Already scheduled visit in this office 05/01/2023.  Robley Fries, PhD [name], PhD LP Clinical Psychologist, [name] [address], [address] [place], [place] [address] [phone]

## 2023-05-01 ENCOUNTER — Ambulatory Visit: Payer: Commercial Managed Care - PPO | Admitting: Psychiatry

## 2023-05-11 ENCOUNTER — Ambulatory Visit: Payer: Commercial Managed Care - PPO | Admitting: Psychiatry

## 2023-05-15 ENCOUNTER — Ambulatory Visit: Payer: Commercial Managed Care - PPO | Admitting: Psychiatry

## 2023-05-15 DIAGNOSIS — Z638 Other specified problems related to primary support group: Secondary | ICD-10-CM | POA: Diagnosis not present

## 2023-05-15 DIAGNOSIS — G8929 Other chronic pain: Secondary | ICD-10-CM

## 2023-05-15 DIAGNOSIS — Z62820 Parent-biological child conflict: Secondary | ICD-10-CM | POA: Diagnosis not present

## 2023-05-15 DIAGNOSIS — F411 Generalized anxiety disorder: Secondary | ICD-10-CM

## 2023-05-15 DIAGNOSIS — F4323 Adjustment disorder with mixed anxiety and depressed mood: Secondary | ICD-10-CM

## 2023-05-15 NOTE — Progress Notes (Addendum)
Psychotherapy Progress Note [name] [name], PhD LP  Patient ID: Tonya Yang [facility])    MRN: [id number] Therapy format: Individual psychotherapy Date: 05/15/2023      Start: 2:13p     Stop: 3:00p     Time Spent: 47 min Location: In-person   Session narrative (presenting needs, interim history, self-report of stressors and symptoms, applications of prior therapy, status changes, and interventions made in session) Realizes she's going to have to make a plan for mom for when she is no longer able to live alone.  Hard to consider, since the sibs already tried some years back to get her budgeting and other life management in shape, then the falling out with Richard.  Mom does have stable housing -- owns the house outright, and father pays the utilities, by court order.  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  No longer psychotic, she has been difficult, as noted, for needy/entitled sounding hints of counting on moving in when the time comes.  Currently, she lives in a very darkened house -- vampire-worthy, by Mechille's estimation -- and does not go outside for days, realistically driving chronic dyssomnia and distress.  That said, did spend a week with her around Mother's Day, and had an unexpectedly lovely time.  Opening the possibility it wont be so bad working through.  Encouraged to still do some act-finding about facilities as well as models of care short of live-in facility and merged household living.    Reveals new hx of F estranging from her as he took up with new wife [name], with overall impression she brought jealous/defensive attitudes to joining his family and in hurtful ways he has parroted her disdain.  Validated loss, probed whether it truly means he has turned against her or is more being weak-willed and captive to a dominating mate's attitude.  Discussed outlook for  therapy.  Still wants weekly if she can get it, feeling the general need for support while pain issues and the tension of family circumstances and arrangements remain on the table.  Therapeutic modalities: Cognitive Behavioral Therapy, Solution-Oriented/Positive Psychology, and Ego-Supportive  Mental Status/Observations:  Appearance:   Casual     Behavior:  Appropriate  Motor:  Normal  Speech/Language:   Clear and Coherent  Affect:  Appropriate  Mood:  Tense  Thought process:  normal  Thought content:    WNL  Sensory/Perceptual disturbances:    WNL  Orientation:  Fully oriented  Attention:  Good    Concentration:  Good  Memory:  WNL  Insight:    Good  Judgment:   Good  Impulse Control:  Good   Risk Assessment: Danger to Self: No Self-injurious Behavior: No Danger to Others: No Physical Aggression / Violence: No Duty to Warn: No Access to Firearms a concern: No  Assessment of progress:  progressing  Diagnosis:   ICD-10-CM   1. Generalized anxiety disorder  F41.1     2. Relationship problem between parent and child  Z62.820     3. Adjustment disorder with mixed anxiety and depressed mood  F43.23     4. 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 Relationship problem with family member  Z63.8      Plan:  Mother's care dilemma Either alone or with family help, research the forms, costs, and process for elder care to begin framing  the alternative to intolerably taking M in Brainstorm ways to tell M no, but I will ___ instead.  Option to break tension by imagining the worst ways to tell her, not just "good" ones.  Continue broaching the subject of looking at rational alternatives including the spectrum of care from in-home assistance to facility-based care, including independent living with peers. Family conflict Note tendency to dwell on what's wrong/unfair about others' behavior and work toward setting her own limits more than calling it  out in blame Try to limit telling people off, focus on refusing unfair requirements and asserting her wishes without framing them as demands or "should haves" Anxiety/anger management Use relaxing breathing, any method available, to reduce overall tension/anxiety/arousal Practice acknowledgment when frustrated -- recognize when body is trying to say it for her and acknowledge openly or privately instead Try to allow for physical reactions to stress, trust them to return to normal after working the (dreaded) problem Self-affirm when reasonable actions have been taken and it's time to let others respond When tempted to outrage with family, try to clarify wishes before responding Anti-bruxism Habit reversal -- try tongue-thrusting (out, roof of mouth) as a redirected habit to clenching Option focused muscle relaxation -- relax thoroughly when tempted to clench, imagine tongue very heavy Other recommendations/advice -- As may be noted above.  Continue to utilize previously learned skills ad lib. Medication compliance -- Maintain medication as prescribed and work faithfully with relevant prescriber(s) if any changes are desired or seem indicated. Crisis service -- Aware of call list and work-in appts.  Call the clinic on-call service, 988/hotline, 911, or present to [facility] or ER if any life-threatening psychiatric crisis. Followup -- Return for time as already scheduled.  Next scheduled visit with me 05/29/2023.  Next scheduled in this office 05/29/2023.  Robley Fries, PhD [name], PhD LP Clinical Psychologist, [name] [address] [place], [place] [address] [phone]

## 2023-05-29 ENCOUNTER — Ambulatory Visit (INDEPENDENT_AMBULATORY_CARE_PROVIDER_SITE_OTHER): Payer: Commercial Managed Care - PPO | Admitting: Psychiatry

## 2023-05-29 DIAGNOSIS — Z62820 Parent-biological child conflict: Secondary | ICD-10-CM | POA: Diagnosis not present

## 2023-05-29 DIAGNOSIS — F411 Generalized anxiety disorder: Secondary | ICD-10-CM | POA: Diagnosis not present

## 2023-05-29 DIAGNOSIS — Z638 Other specified problems related to primary support group: Secondary | ICD-10-CM | POA: Diagnosis not present

## 2023-05-29 DIAGNOSIS — G8929 Other chronic pain: Secondary | ICD-10-CM

## 2023-05-29 DIAGNOSIS — F4323 Adjustment disorder with mixed anxiety and depressed mood: Secondary | ICD-10-CM

## 2023-05-29 NOTE — Progress Notes (Signed)
Psychotherapy Progress Note [name] Tonya Czar, PhD LP  Patient ID: Tonya Yang [facility])    MRN: [id number] Therapy format: Individual psychotherapy Date: 05/29/2023      Start: 1:13p     Stop: 2:02p     Time Spent: 49 min Location: In-person   Session narrative (presenting needs, interim history, self-report of stressors and symptoms, applications of prior therapy, status changes, and interventions made in session) Bothered Memorial Day W/E by histrionics in Tonya Yang's family, where Tonya Yang's brother got into an accident with construction equipment, it took hours to discover him, and then putting up with exaggerated reports of being on his deathbed.  Was really irksome to feel jerked around.  Support/empathy provided.   Had been thinking about the estrangement with her father and took a chance to send him a card for [name].  (Following last session's disclosures about him being turned by his wife Tonya Yang and parroting her jealous/defensive attitudes.)  Reviewed her card, affirming positive messages and low expectations for what will come of it.  Noted she did take rather compulsively the opportunity to evangelize and express hope that he found the Lord.  Lightly confronted as possibly offputting, or possibly just a bit of a tone deaf witness, and affirmed as good exercise, overall.  Hx of coming up in church starting about 9, following a friend's invitation.  Mother was on board but father held out, and brother Tonya Yang initially went with her then begged out.  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  Therapeutic modalities: Cognitive Behavioral Therapy, Solution-Oriented/Positive Psychology, Environmental manager, and Faith-sensitive  Mental Status/Observations:  Appearance:   Casual     Behavior:   Appropriate  Motor:  Normal  Speech/Language:   Clear and Coherent  Affect:  Appropriate  Mood:  Generally well, wistful  Thought process:  normal  Thought content:    WNL  Sensory/Perceptual disturbances:    WNL  Orientation:  Fully oriented  Attention:  Good    Concentration:  Good  Memory:  WNL  Insight:    Good  Judgment:   Good  Impulse Control:  Good   Risk Assessment: Danger to Self: No Self-injurious Behavior: No Danger to Others: No Physical Aggression / Violence: No Duty to Warn: No Access to Firearms a concern: No  Assessment of progress:  progressing  Diagnosis:   ICD-10-CM   1. Generalized anxiety disorder  F41.1     2. Relationship problem with family member  Z63.8     3. Relationship problem between parent and child  Z62.820     4. Adjustment disorder with mixed anxiety and depressed mood  F43.23     5. 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  Option to break tension by imagining the worst ways to tell her, not just "good" ones.  Continue broaching the subject of looking at rational alternatives including the spectrum of care from in-home assistance to facility-based care, including independent living with peers. Family conflict Note tendency to dwell on what's wrong/unfair about others' behavior and work toward setting her  own limits more than calling it out in blame Try to limit telling people off, focus on refusing unfair requirements and asserting her wishes without framing them as demands or "should haves" OK to pursue reconciliation with father.  Be mindful of tendency to preach and be mistaken for judgmental, try to center more on her own care, concern, and wishes for him and with him.  Try  to bypass any grudging feelings toward Tonya Yang and, more than any blame, seek Tonya Yang's courage to connect. Anxiety/anger management Use relaxing breathing, any method available, to reduce overall tension/anxiety/arousal Practice acknowledgment when frustrated -- recognize when body is trying to say it for her and acknowledge openly or privately instead Try to allow for physical reactions to stress, trust them to return to normal after working the (dreaded) problem Self-affirm when reasonable actions have been taken and it's time to let others respond When tempted to outrage with family, try to clarify wishes before responding Anti-bruxism Habit reversal -- try tongue-thrusting (out, roof of mouth) as a redirected habit to clenching Option focused muscle relaxation -- relax thoroughly when tempted to clench, imagine tongue very heavy Other recommendations/advice -- As may be noted above.  Continue to utilize previously learned skills ad lib. Medication compliance -- Maintain medication as prescribed and work faithfully with relevant prescriber(s) if any changes are desired or seem indicated. Crisis service -- Aware of call list and work-in appts.  Call the clinic on-call service, 988/hotline, 911, or present to [facility] or ER if any life-threatening psychiatric crisis. Followup -- Return for time as already scheduled.  Next scheduled visit with me 06/08/2023.  Next scheduled in this office 06/08/2023.  Robley Fries, PhD Tonya Czar, PhD LP Clinical Psychologist, [name] [address] [place], [place] [address] [phone]

## 2023-06-08 ENCOUNTER — Ambulatory Visit: Payer: Commercial Managed Care - PPO | Admitting: Psychiatry

## 2023-06-12 ENCOUNTER — Ambulatory Visit: Payer: Commercial Managed Care - PPO | Admitting: Psychiatry

## 2023-06-12 DIAGNOSIS — Z62820 Parent-biological child conflict: Secondary | ICD-10-CM

## 2023-06-12 DIAGNOSIS — F4323 Adjustment disorder with mixed anxiety and depressed mood: Secondary | ICD-10-CM | POA: Diagnosis not present

## 2023-06-12 DIAGNOSIS — F411 Generalized anxiety disorder: Secondary | ICD-10-CM | POA: Diagnosis not present

## 2023-06-12 DIAGNOSIS — G8929 Other chronic pain: Secondary | ICD-10-CM

## 2023-06-12 DIAGNOSIS — Z630 Problems in relationship with spouse or partner: Secondary | ICD-10-CM | POA: Diagnosis not present

## 2023-06-12 NOTE — Progress Notes (Signed)
Psychotherapy Progress Note [name] [name], PhD LP  Patient ID: Lateasha Britz [facility])    MRN: [id number] Therapy format: Individual psychotherapy Date: 06/12/2023      Start: 2:06     Stop: 2:55p     Time Spent: 49 min Location: In-person   Session narrative (presenting needs, interim history, self-report of stressors and symptoms, applications of prior therapy, status changes, and interventions made in session) Been watching a lot of true crime shows with [name] lately.  Did send F a F's Day card, as noted, and got no response initially.  3rd hand report later that F was hospitalized with a heart issue while away in [place].  Makes it seem more important that she included word about his salvation in her message (i.e., as she worries he will not be saved if she doesn't bring it up).  Decided to take another chance and send flowers to him at the hospital, but of course can't know if it mattered.  All of F's relatives maintain the wall somehow against contact with her, though not clear what it's about, and she knows from experience that they will simultaneously shun her and blame her for not coming to visit him.  Acknowledged, and challenged to still watch the tendency to assume things said or meant.  Does have one aunt who will be in touch.  Last week U Ree Kida passed away, before she and M could make the effort to travel to [place].  Stricken about it.  Felt some salt in the wound lately herself, hearing from M how [name] understood about her having been unable to reopen relationship with her sister, while bitter irony at how Gerlene Burdock has walled off Emelie himself.  Support/empathy provided vs. The seeming wall of rejection among family, when her understanding is she's only tried to care.  At home, wants advice on [name].  Feeling at rope's end with his moods.  Has seen it throughout their marriage how he gets unexpectedly gruff.  Knows he's been dx'd ADHD and is on Adderall,  so impatience may just be a thing.  She has long harbored doubts whether she made a mistake getting married, feels she said yes despite a gnawing feeling.  Main c/o him taking a mean tone, making it her responsibility to be speak nicer and to give him passes if he had good intentions, and accuses him of whitewashing how he actually said things, which her purportedly echoic memory dos not support.  She has tried to write him notes before as a way of trying to soften thing, but claims he has said he "won't read hate mail".  Also a habit of yelling at her outside, in earshot of neighbors, which is embarrassing and demoralizing.  Support/empathy provided, discussed options how to respond to deescalate his anger without feeling like she's selling herself out.  Therapeutic modalities: Cognitive Behavioral Therapy, Solution-Oriented/Positive Psychology, Ego-Supportive, Faith-sensitive, and Assertiveness/Communication  Mental Status/Observations:  Appearance:   Casual     Behavior:  Appropriate  Motor:  Normal  Speech/Language:   Clear and Coherent  Affect:  Appropriate  Mood:  anxious and dysthymic  Thought process:  normal  Thought content:    WNL  Sensory/Perceptual disturbances:    WNL  Orientation:  Fully oriented  Attention:  Good    Concentration:  Good  Memory:  WNL  Insight:    Good  Judgment:   Good  Impulse Control:  Good   Risk Assessment: Danger to Self: No  Self-injurious Behavior: No Danger to Others: No Physical Aggression / Violence: No Duty to Warn: No Access to Firearms a concern: No  Assessment of progress:  situational setback(s)  Diagnosis:   ICD-10-CM   1. Generalized anxiety disorder  F41.1     2. Relationship problem between parent and child  Z62.820     3. Relationship problem between partners  Z63.0     4. Adjustment disorder with mixed anxiety and depressed mood  F43.23     5. 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  Option to break tension by imagining the worst ways to tell her, not just "good" ones.  Continue broaching the subject of looking at rational alternatives including the spectrum of care from in-home assistance to facility-based care, including independent living with peers. Family conflict Note tendency to dwell on what's wrong/unfair about others' behavior and work toward setting her own limits and asking what people want more than calling it out in blame Try to limit telling people off, focus on refusing unfair requirements and asserting her wishes without framing them as demands or "should haves" Watch tendency to proselytize when empathizing will reach better When tempted to outrage with family, try to clarify wishes before responding OK to pursue reconciliation with father.  Be mindful of tendency to preach and be mistaken for judgmental, try to center more on her own care, concern, and wishes for him and with him.  Try to bypass any grudging feelings toward [name] and, more than any blame, seek F's courage to connect. If useful, open to including [name] to help with domestic conflict Anxiety/anger management Use relaxing breathing, any method available, to reduce overall tension/anxiety/arousal Practice acknowledgment when frustrated -- recognize when body is trying to say it for her and acknowledge openly or privately instead Try to allow for physical reactions to stress, trust them to return to normal after working the (dreaded) problem Self-affirm when reasonable actions have been taken and it's time to let others respond Anti-bruxism Habit reversal -- try tongue-thrusting (out, roof of mouth) as a redirected habit to clenching Option focused muscle  relaxation -- relax thoroughly when tempted to clench, imagine tongue very heavy Other recommendations/advice -- As may be noted above.  Continue to utilize previously learned skills ad lib. Medication compliance -- Maintain medication as prescribed and work faithfully with relevant prescriber(s) if any changes are desired or seem indicated. Crisis service -- Aware of call list and work-in appts.  Call the clinic on-call service, 988/hotline, 911, or present to [facility] or ER if any life-threatening psychiatric crisis. Followup -- Return for time as already scheduled.  Next scheduled visit with me 06/27/2023.  Next scheduled in this office 06/27/2023.  Robley Fries, PhD [name], PhD LP Clinical Psychologist, [name] [address] [place], [place] [address] [phone]

## 2023-06-27 ENCOUNTER — Ambulatory Visit (INDEPENDENT_AMBULATORY_CARE_PROVIDER_SITE_OTHER): Payer: Commercial Managed Care - PPO | Admitting: Psychiatry

## 2023-06-27 DIAGNOSIS — Z630 Problems in relationship with spouse or partner: Secondary | ICD-10-CM | POA: Diagnosis not present

## 2023-06-27 DIAGNOSIS — F411 Generalized anxiety disorder: Secondary | ICD-10-CM

## 2023-06-27 DIAGNOSIS — F4323 Adjustment disorder with mixed anxiety and depressed mood: Secondary | ICD-10-CM

## 2023-06-27 DIAGNOSIS — Z62820 Parent-biological child conflict: Secondary | ICD-10-CM | POA: Diagnosis not present

## 2023-06-27 DIAGNOSIS — Z638 Other specified problems related to primary support group: Secondary | ICD-10-CM

## 2023-06-27 NOTE — Progress Notes (Signed)
Psychotherapy Progress Note [name] [name], PhD LP  Patient ID: Tonya Yang [facility])    MRN: [id number] Therapy format: Individual psychotherapy Date: 06/27/2023      Start: 3:17p     Stop: 4:05p     Time Spent: 48 min Location: In-person   Session narrative (presenting needs, interim history, self-report of stressors and symptoms, applications of prior therapy, status changes, and interventions made in session) Vacationed with [name] to see mother, which went pretty well.  Bothered on the ride and since by what is possibly sciatica, possibly IT band tightness.  Advised to seek PT.  Finding herself substantially relieved lately with mother's behavior, has felt that [place] must have been specifically praying for her.  Finding M distinctly non-manipulative, and more fun to be around, even if affected by vertigo.  Credit to Pt for setting boundaries better and asserting   News that B and F, who have both reopened contact after 10 years.  Recent attempts made to reach out to F, a card, then flowers, while he is in hospital for cardiac problems.  Non-answer from him had seemed like there would be no opening, but took an opportunity to go drop in on him in the hospital.  It also made the opportunity to encounter brother, in ways that seems to melt the ice.  Visibly stilted, some short answers, but seems like a breakthrough.  Discussed outlook and hopes for further contact, and realistic indications he is playing it reserved out of still suspected emotional captivity to [place], tempering hopes to prevent feeling devastated later and encouraging just manage pace and content if there is risk of coming on too strong.  Also note if his condition is so fatiguing that excitement is fundamentally to much to hope for.  Therapeutic modalities: Cognitive Behavioral Therapy, Solution-Oriented/Positive Psychology, Environmental manager, and Faith-sensitive  Mental  Status/Observations:  Appearance:   Casual     Behavior:  Appropriate  Motor:  Normal  Speech/Language:   Clear and Coherent  Affect:  Appropriate  Mood:  rising  Thought process:  normal  Thought content:    WNL  Sensory/Perceptual disturbances:    WNL  Orientation:  Fully oriented  Attention:  Good    Concentration:  Good  Memory:  WNL  Insight:    Variable  Judgment:   Good  Impulse Control:  Good   Risk Assessment: Danger to Self: No Self-injurious Behavior: No Danger to Others: No Physical Aggression / Violence: No Duty to Warn: No Access to Firearms a concern: No  Assessment of progress:  progressing  Diagnosis:   ICD-10-CM   1. Generalized anxiety disorder  F41.1     2. Relationship problem between parent and child  Z62.820     3. Relationship problem between partners  Z63.0     4. Relationship problem with family member  Z63.8     5. Adjustment disorder with mixed anxiety and depressed mood  F43.23      Plan:  Mother's care dilemma Either alone or with family help, research the forms, costs, and process for elder care to begin framing the alternative to intolerably taking M in Brainstorm ways to tell M no, but I will ___ instead.  Option to break tension by imagining the worst ways to tell her, not just "good" ones.  Continue broaching the subject of looking at rational alternatives including the spectrum of care from in-home assistance to facility-based care, including independent living with peers. Family conflict Note tendency to  dwell on what's wrong/unfair about others' behavior and work toward setting her own limits and asking what people want more than calling it out in blame Try to limit telling people off, focus on refusing unfair requirements and asserting her wishes without framing them as demands or "should haves" Look for friendlier behavior to reward somehow, with explicit thanks or just more of the kind of time her counterpart wants ("stealth  behavior therapy") Watch tendency to proselytize when empathizing will reach better When tempted to outrage with family, try to clarify wishes before responding OK to pursue reconciliation with father.  Be mindful of tendency to preach and be mistaken for judgmental, try to center more on her own care, concern, and wishes for him and with him.  Try to bypass any grudging feelings toward [name] and, more than any blame, seek F's courage to connect. If useful, open to including [name] to help with domestic conflict Anxiety/anger management Use relaxing breathing, any method available, to reduce overall tension/anxiety/arousal Practice acknowledgment when frustrated -- recognize when body is trying to say it for her and acknowledge openly or privately instead Try to allow for physical reactions to stress, trust them to return to normal after working the (dreaded) problem Self-affirm when reasonable actions have been taken and it's time to let others respond Anti-bruxism Habit reversal -- try tongue-thrusting (out, roof of mouth) as a redirected habit to clenching Option focused muscle relaxation -- relax thoroughly when tempted to clench, imagine tongue very heavy Other recommendations/advice -- As may be noted above.  Continue to utilize previously learned skills ad lib. Medication compliance -- Maintain medication as prescribed and work faithfully with relevant prescriber(s) if any changes are desired or seem indicated. Crisis service -- Aware of call list and work-in appts.  Call the clinic on-call service, 988/hotline, 911, or present to [facility] or ER if any life-threatening psychiatric crisis. Followup -- Return for time as already scheduled.  Next scheduled visit with me 07/03/2023.  Next scheduled in this office 07/03/2023.  Robley Fries, PhD [name], PhD LP Clinical Psychologist, [name] [address] [place],  [place] [address] [phone]

## 2023-07-03 ENCOUNTER — Ambulatory Visit (INDEPENDENT_AMBULATORY_CARE_PROVIDER_SITE_OTHER): Payer: Commercial Managed Care - PPO | Admitting: Psychiatry

## 2023-07-03 DIAGNOSIS — Z638 Other specified problems related to primary support group: Secondary | ICD-10-CM

## 2023-07-03 DIAGNOSIS — Z62820 Parent-biological child conflict: Secondary | ICD-10-CM | POA: Diagnosis not present

## 2023-07-03 DIAGNOSIS — G8929 Other chronic pain: Secondary | ICD-10-CM

## 2023-07-03 DIAGNOSIS — F4323 Adjustment disorder with mixed anxiety and depressed mood: Secondary | ICD-10-CM

## 2023-07-03 DIAGNOSIS — F411 Generalized anxiety disorder: Secondary | ICD-10-CM | POA: Diagnosis not present

## 2023-07-03 NOTE — Progress Notes (Signed)
Psychotherapy Progress Note [name] Tonya Czar, PhD LP  Patient ID: Tonya Yang [facility])    MRN: [id number] Therapy format: Individual psychotherapy Date: 07/03/2023      Start: 3:13p     Stop: 4:03p     Time Spent: 50 min Location: In-person   Session narrative (presenting needs, interim history, self-report of stressors and symptoms, applications of prior therapy, status changes, and interventions made in session) TMJ is much better at this point, a flareup week before last, attribute to family stress with F in hospital and M visit.  Discovered that she has stopped a habit of worrying a tooth with a crown.  Credit to breathing exercises and habit redirection (tongue thrusting in place of jaw tension).  On the agenda to contact neurosurgeon about what seems to be sciatica.  Affirmed and encouraged.  Father has not followed up any on messages left since the small breakthrough at the hospital.  [name] not particularly in touch, either.  Obviously disappointing.  Bothered by B still not being that much in touch with M, either, reckoning that he was alienated by her prominently manic/paranoid episode, particularly for M alleging (in psychosis) that his wife Tonya Yang would steal from her and play tricks on her, and him taking it seriously instead of understanding she wasn't in her right mind.  Memories of mother being harsh with discipline, unyielding about judgments, with notable groundings and other punishments that were invalidating and anxiety-provoking throughout youth.    New hx of episodes of trichotillomania, under control and understood to have been discharging emotional/family tension.  Therapeutic modalities: Cognitive Behavioral Therapy, Solution-Oriented/Positive Psychology, and Ego-Supportive  Mental Status/Observations:  Appearance:   Casual     Behavior:  Appropriate  Motor:  Normal  Speech/Language:   Clear and Coherent  Affect:  Appropriate   Mood:  Less anxious, more just disappointed re family  Thought process:  normal  Thought content:    WNL  Sensory/Perceptual disturbances:    WNL  Orientation:  Fully oriented  Attention:  Good    Concentration:  Good  Memory:  WNL  Insight:    Good  Judgment:   Good  Impulse Control:  Good   Risk Assessment: Danger to Self: No Self-injurious Behavior: No Danger to Others: No Physical Aggression / Violence: No Duty to Warn: No Access to Firearms a concern: No  Assessment of progress:  progressing well  Diagnosis:   ICD-10-CM   1. Generalized anxiety disorder  F41.1     2. Relationship problem with family member  Z63.8     3. Relationship problem between parent and child  Z62.820     4. Adjustment disorder with mixed anxiety and depressed mood  F43.23     5. 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  Option to break tension by imagining the worst ways to tell her, not just "good" ones.  Continue broaching the subject of looking at rational alternatives including the spectrum of care from in-home assistance to facility-based care, including independent living with peers. Family conflict Note tendency to dwell on what's wrong/unfair about others' behavior and work toward setting her own limits and asking what people want  more than calling it out in blame Try to limit telling people off, focus on refusing unfair requirements and asserting her wishes without framing them as demands or "should haves" Look for friendlier behavior to reward somehow, with explicit thanks or just more of the kind of time her counterpart wants ("stealth behavior therapy") Watch tendency to proselytize when empathizing will reach better When  tempted to outrage with family, try to clarify wishes before responding OK to pursue reconciliation with father.  Be mindful of tendency to preach and be mistaken for judgmental, try to center more on her own care, concern, and wishes for him and with him.  Try to bypass any grudging feelings toward Tonya Yang and, more than any blame, seek F's courage to connect. If useful, open to including Tonya Yang to help with domestic conflict Anxiety/anger management Use relaxing breathing, any method available, to reduce overall tension/anxiety/arousal Practice acknowledgment when frustrated -- recognize when body is trying to say it for her and acknowledge openly or privately instead Try to allow for physical reactions to stress, trust them to return to normal after working the (dreaded) problem Self-affirm when reasonable actions have been taken and it's time to let others respond Anti-bruxism Habit reversal -- try tongue-thrusting (out, roof of mouth) as a redirected habit to clenching Option focused muscle relaxation -- relax thoroughly when tempted to clench, imagine tongue very heavy Other recommendations/advice -- As may be noted above.  Continue to utilize previously learned skills ad lib. Medication compliance -- Maintain medication as prescribed and work faithfully with relevant prescriber(s) if any changes are desired or seem indicated. Crisis service -- Aware of call list and work-in appts.  Call the clinic on-call service, 988/hotline, 911, or present to [facility] or ER if any life-threatening psychiatric crisis. Followup -- Return for time as already scheduled.  Next scheduled visit with me 07/14/2023.  Next scheduled in this office 07/14/2023.  Tonya Fries, PhD Tonya Czar, PhD LP Clinical Psychologist, [name] [address], [address] [place], [place] [address] [phone]

## 2023-07-14 ENCOUNTER — Ambulatory Visit (INDEPENDENT_AMBULATORY_CARE_PROVIDER_SITE_OTHER): Payer: Commercial Managed Care - PPO | Admitting: Psychiatry

## 2023-07-14 DIAGNOSIS — G8929 Other chronic pain: Secondary | ICD-10-CM

## 2023-07-14 DIAGNOSIS — Z62820 Parent-biological child conflict: Secondary | ICD-10-CM | POA: Diagnosis not present

## 2023-07-14 DIAGNOSIS — F4323 Adjustment disorder with mixed anxiety and depressed mood: Secondary | ICD-10-CM

## 2023-07-14 DIAGNOSIS — F411 Generalized anxiety disorder: Secondary | ICD-10-CM | POA: Diagnosis not present

## 2023-07-14 DIAGNOSIS — Z638 Other specified problems related to primary support group: Secondary | ICD-10-CM

## 2023-07-14 NOTE — Progress Notes (Signed)
Psychotherapy Progress Note [name] [name], PhD LP  Patient ID: Tonya Yang [facility])    MRN: [id number] Therapy format: Individual psychotherapy Date: 07/14/2023      Start: 11:13a     Stop: 12:03p     Time Spent: 50 min Location: In-person   Session narrative (presenting needs, interim history, self-report of stressors and symptoms, applications of prior therapy, status changes, and interventions made in session) Father still hasn't voluntarily gotten in touch, since she surprise-visited him in the hospital and has left a few follow up messages.  Dismayed but trying to be stoic and accepting.  Realizes he didn't sign onto a renewed relationship at the time, either.  Been thinking about whether to send a care package of some kind, in hopes of warming him up, but on discussion has pretty well decided she just shouldn't try too hard, take the nonverbal signals she has, let him choose whether to make an effort.  Been aggravated more by the idea that she has to witness to him, has a godly obligation.  Examined values and principles, with supportive challenge whether God is actually asking her to be responsible for his acceptance or just to sow the seed she has and let her example be her witness when words don't work.    Therapeutic modalities: Cognitive Behavioral Therapy, Solution-Oriented/Positive Psychology, Environmental manager, and Faith-sensitive  Mental Status/Observations:  Appearance:   Casual     Behavior:  Appropriate  Motor:  Normal  Speech/Language:   Clear and Coherent  Affect:  Appropriate  Mood:  Concerned, somewhat saddened , responsive  Thought process:  normal  Thought content:    WNL  Sensory/Perceptual disturbances:    WNL  Orientation:  Fully oriented  Attention:  Good    Concentration:  Good  Memory:  WNL  Insight:    Good  Judgment:   Good  Impulse Control:  Good   Risk Assessment: Danger to Self: No Self-injurious Behavior:  No Danger to Others: No Physical Aggression / Violence: No Duty to Warn: No Access to Firearms a concern: No  Assessment of progress:  progressing  Diagnosis:   ICD-10-CM   1. Generalized anxiety disorder  F41.1     2. Adjustment disorder with mixed anxiety and depressed mood  F43.23     3. Relationship problem between parent and child  Z62.820     4. Relationship problem with family member  Z63.8     5. 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    G89.29      Plan:  Family conflict Note tendency to dwell on what's wrong/unfair about others' behavior and work toward setting her own limits and asking what people want more than calling it out in blame Try to limit telling people off, focus on refusing unfair requirements and asserting her wishes without framing them as demands or "should haves" Look for friendlier behavior to reward somehow, with explicit thanks or just more of the kind of time her counterpart wants ("stealth behavior therapy") Watch tendency to proselytize, when empathizing and example will reach better When tempted to outrage with family, try to clarify wishes before responding OK to pursue reconciliation with father.  Be mindful of tendency to preach and be mistaken for judgmental, try to center more on her own care, concern, and wishes for him and with him.  Try to bypass any grudging feelings toward Tonya Yang and, more than any blame, seek F's courage to connect. If useful, open  to including Tonya Yang to help with domestic conflict Anxiety/anger management Use relaxing breathing, any method available, to reduce overall tension/anxiety/arousal Practice acknowledgment when frustrated -- recognize when body is trying to say it for her and acknowledge openly or privately instead Try to allow for physical reactions to stress, trust them to return to normal after working the (dreaded) problem Self-affirm when reasonable actions have been taken and it's time  to let others respond Anti-bruxism Habit reversal -- try tongue-thrusting (out, roof of mouth) as a redirected habit to clenching Option focused muscle relaxation -- relax thoroughly when tempted to clench, imagine tongue very heavy Mother's care dilemma Either alone or with family help, research the forms, costs, and process for elder care to begin framing the alternative to intolerably taking Tonya Yang in Brainstorm ways to tell Tonya Yang no, but I will ___ instead.  Option to break tension by imagining the worst ways to tell her, not just "good" ones.  Continue broaching the subject of looking at rational alternatives including the spectrum of care from in-home assistance to facility-based care, including independent living with peers. Other recommendations/advice -- As may be noted above.  Continue to utilize previously learned skills ad lib. Medication compliance -- Maintain medication as prescribed and work faithfully with relevant prescriber(s) if any changes are desired or seem indicated. Crisis service -- Aware of call list and work-in appts.  Call the clinic on-call service, 988/hotline, 911, or present to [facility] or ER if any life-threatening psychiatric crisis. Followup -- Return for time as already scheduled.  Next scheduled visit with me 07/21/2023.  Next scheduled in this office 07/21/2023.  Robley Fries, PhD [name], PhD LP Clinical Psychologist, [name] [address] [place], [place] [address] [phone]

## 2023-07-17 ENCOUNTER — Other Ambulatory Visit: Payer: Self-pay | Admitting: Obstetrics and Gynecology

## 2023-07-17 ENCOUNTER — Encounter: Payer: Self-pay | Admitting: Obstetrics and Gynecology

## 2023-07-17 DIAGNOSIS — Z853 Personal history of malignant neoplasm of breast: Secondary | ICD-10-CM

## 2023-07-17 DIAGNOSIS — E2839 Other primary ovarian failure: Secondary | ICD-10-CM

## 2023-07-17 DIAGNOSIS — M8580 Other specified disorders of bone density and structure, unspecified site: Secondary | ICD-10-CM

## 2023-07-17 DIAGNOSIS — Z780 Asymptomatic menopausal state: Secondary | ICD-10-CM

## 2023-07-17 NOTE — Telephone Encounter (Signed)
Last AEX 08/30/2022--scheduled for 09/03/2022. Last DEXA 07/19/2021-osteopenia (T-score -1.8)  Ok to send order?

## 2023-07-17 NOTE — Progress Notes (Signed)
Order for bone density placed 

## 2023-07-21 ENCOUNTER — Ambulatory Visit: Payer: Commercial Managed Care - PPO | Admitting: Psychiatry

## 2023-07-26 ENCOUNTER — Ambulatory Visit: Payer: Commercial Managed Care - PPO | Admitting: Psychiatry

## 2023-07-26 DIAGNOSIS — F4323 Adjustment disorder with mixed anxiety and depressed mood: Secondary | ICD-10-CM | POA: Diagnosis not present

## 2023-07-26 DIAGNOSIS — Z62820 Parent-biological child conflict: Secondary | ICD-10-CM | POA: Diagnosis not present

## 2023-07-26 DIAGNOSIS — Z638 Other specified problems related to primary support group: Secondary | ICD-10-CM | POA: Diagnosis not present

## 2023-07-26 DIAGNOSIS — G8929 Other chronic pain: Secondary | ICD-10-CM

## 2023-07-26 DIAGNOSIS — F411 Generalized anxiety disorder: Secondary | ICD-10-CM | POA: Diagnosis not present

## 2023-07-26 NOTE — Progress Notes (Signed)
Psychotherapy Progress Note [name] [name], PhD LP  Patient ID: Kriston Kender [facility])    MRN: [id number] Therapy format: Individual psychotherapy Date: 07/26/2023      Start: 1:13p     Stop: 2:03p     Time Spent: 50 min Location: In-person   Session narrative (presenting needs, interim history, self-report of stressors and symptoms, applications of prior therapy, status changes, and interventions made in session) Mom continues to be considerably easier to deal with, and more amicable.  Started her on B12 recently, after discovering its benefits with her husband and herself.  Affirmed and encouraged both in hr own care and in noticing and naturally rewarding [name] more welcome behavior.  Q about meds -- is on both Wellbutrin and Cymbalta at this time.  Used to be on Lexapro, which did well for anxiety.  There was a decision to go off it b/c TMJ specialist told her SSRIs have a reputation for bruxism/clenching.  Going onto SDRI lost a lot of that good effect for anxiety but also lost the bruxism (unclear if that was true or power of suggestion).  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  Not sure which antidepressant/antianxiety is best for her.  Reviewed usual indications for the different classes, referred back to prescriber to consult.  Noted how genetic testing has been helpful in psychiatric practice for cutting down guesswork, suggested her prescribing PCP would have access and obvious use for it if she were to consult him.  Reactive to the suggestion, as she is wary of providing a DNA sample and being somehow more susceptible to misuse of her data.  Supportively confronted as still helpful and trusted in our experience, and that knowing things like whether she processes B12 and folate differently could really help tune her treatment.  Meanwhile, possible that something less activating in the SSRI group could still be  appropriate, and she can get more out of antiinflammatory strategies for pain than trying to rely on a noradrenergic medication.  Has looked into her insurance coverage, says she has been told there ar no restrictions on use of behavioral health services.    Notes hx of aggressive allergic responses, suggesting some hyperactive physiological response, possible role of inflammatory response in higher anxiety.  Therapeutic modalities: Cognitive Behavioral Therapy, Solution-Oriented/Positive Psychology, and Ego-Supportive  Mental Status/Observations:  Appearance:   Casual     Behavior:  Appropriate  Motor:  Normal  Speech/Language:   Clear and Coherent  Affect:  Appropriate  Mood:  normal and concerned  Thought process:  normal  Thought content:    WNL  Sensory/Perceptual disturbances:    WNL  Orientation:  Fully oriented  Attention:  Good    Concentration:  Good  Memory:  WNL  Insight:    Good  Judgment:   Good  Impulse Control:  Good   Risk Assessment: Danger to Self: No Self-injurious Behavior: No Danger to Others: No Physical Aggression / Violence: No Duty to Warn: No Access to Firearms a concern: No  Assessment of progress:  progressing  Diagnosis:   ICD-10-CM   1. Generalized anxiety disorder  F41.1     2. Adjustment disorder with mixed anxiety and depressed mood  F43.23     3. Relationship problem between parent and child  Z62.820     4. Relationship problem with family member  Z63.8     5. 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  G89.29      Plan:  Family conflict Note tendency to dwell on what's wrong/unfair about others' behavior and work toward setting her own limits and asking what people want more than calling it out in blame Try to limit telling people off, focus on refusing unfair requirements and asserting her wishes without framing them as demands or "should haves" Look for friendlier behavior to reward somehow, with explicit  thanks or just more of the kind of time her counterpart wants ("stealth behavior therapy") Watch tendency to proselytize, when empathizing and example will reach better When tempted to outrage with family, try to clarify wishes before responding OK to pursue reconciliation with father.  Be mindful of tendency to preach and be mistaken for judgmental, try to center more on her own care, concern, and wishes for him and with him.  Try to bypass any grudging feelings toward [name] and, more than any blame, seek F's courage to connect. If useful, open to including [name] to help with domestic conflict Anxiety/anger management Use relaxing breathing, any method available, to reduce overall tension/anxiety/arousal Practice acknowledgment when frustrated -- recognize when body is trying to say it for her and acknowledge openly or privately instead Try to allow for physical reactions to stress, trust them to return to normal after working the (dreaded) problem Self-affirm when reasonable actions have been taken and it's time to let others respond Consider further assessing inflammatory response and treatment to ton down negative emotional intensity and pain issues Pain and anti-bruxism Continue habit reversal technique for jaw tension (tongue-thrusting, blowing gently and slowly in place of clenching) Option focused muscle relaxation -- relax thoroughly when tempted to clench, imagine tongue very heavy Mother's care dilemma Either alone or with family help, research the forms, costs, and process for elder care to begin framing the alternative to intolerably taking M in Brainstorm ways to tell M no, but I will ___ instead.  Option to break tension by imagining the worst ways to tell her, not just "good" ones.  Continue broaching the subject of looking at rational alternatives including the spectrum of care from in-home assistance to facility-based care, including independent living with peers. Other  recommendations/advice -- As may be noted above.  Continue to utilize previously learned skills ad lib. Medication compliance -- Maintain medication as prescribed and work faithfully with relevant prescriber(s) if any changes are desired or seem indicated. Crisis service -- Aware of call list and work-in appts.  Call the clinic on-call service, 988/hotline, 911, or present to [facility] or ER if any life-threatening psychiatric crisis. Followup -- Return for time as already scheduled.  Next scheduled visit with me 08/04/2023.  Next scheduled in this office 08/04/2023.  Robley Fries, PhD [name], PhD LP Clinical Psychologist, [name] [address], [address] [place], [place] [address] [phone]

## 2023-08-04 ENCOUNTER — Ambulatory Visit (INDEPENDENT_AMBULATORY_CARE_PROVIDER_SITE_OTHER): Payer: Commercial Managed Care - PPO | Admitting: Psychiatry

## 2023-08-04 DIAGNOSIS — Z630 Problems in relationship with spouse or partner: Secondary | ICD-10-CM | POA: Diagnosis not present

## 2023-08-04 DIAGNOSIS — F411 Generalized anxiety disorder: Secondary | ICD-10-CM

## 2023-08-04 DIAGNOSIS — F4323 Adjustment disorder with mixed anxiety and depressed mood: Secondary | ICD-10-CM

## 2023-08-04 DIAGNOSIS — Z62820 Parent-biological child conflict: Secondary | ICD-10-CM

## 2023-08-04 DIAGNOSIS — G8929 Other chronic pain: Secondary | ICD-10-CM

## 2023-08-04 NOTE — Progress Notes (Signed)
Psychotherapy Progress Note [name] Tonya Czar, PhD LP  Patient ID: Tonya Yang [facility])    MRN: [id number] Therapy format: Individual psychotherapy Date: 08/04/2023      Start: 2:23p     Stop: 3:13p     Time Spent: 50 min Location: In-person   Session narrative (presenting needs, interim history, self-report of stressors and symptoms, applications of prior therapy, status changes, and interventions made in session) Tonya Yang went home from rehab, able to know b/c brother was updating Tonya Yang that much, but then Tonya Yang did group text that Tonya Yang was home, and included Tonya Yang.  Tonya Yang also answered a couple direct questions on follow up.  Still terse, and not reciprocating, but a little bit open.  Still figures it's worth keeping a relaxed distance, not trying too hard.  Affirmed and encouraged.  Tonya Yang has been dealing with what sounds like irresponsible decision-making on Tonya Yang husband Tonya Yang's part.  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  A year and a half ago, when things started melting down with the arrangement of Tonya Yang & Tonya Yang living with mother, Tonya Yang was not getting offered work, not finding another job, and understanding is that Tonya Yang got blackballed within the industry/union for pulling pranks on the job.  Smoking weed more openly, and mother was indulging some magical thinking about him while in Tonya Yang more altered state.  Issues at home with Tonya Yang compulsively talking about politics, when she wants to tone it down, hear less to worry about that she can't control.  Relationship OK otherwise.  Support/empathy provided.   C/o frequent dreams of being responsible for things beyond Tonya Yang.  Simultaneously weaning Cymbalta right now on Tonya Yang own.  Does not feel they are related, but suggested Tonya Yang anxiety could be amplified by weaning.  Not that she can't, just  that the motional brain could easily recognize changes and have some instinctive alarm about it.  Encouraged to practice self-soothing and acceptance that alarms are temporary and dreams are working it out, not any sign of deterioration.  Therapeutic modalities: Cognitive Behavioral Therapy, Solution-Oriented/Positive Psychology, Environmental manager, and Faith-sensitive  Mental Status/Observations:  Appearance:   Casual     Behavior:  Appropriate  Motor:  Normal  Speech/Language:   Clear and Coherent  Affect:  Appropriate  Mood:  anxious and controlled  Thought process:  normal  Thought content:    WNL  Sensory/Perceptual disturbances:    WNL  Orientation:  Fully oriented  Attention:  Good    Concentration:  Good  Memory:  WNL  Insight:    Good  Judgment:   Good  Impulse Control:  Good   Risk Assessment: Danger to Self: No Self-injurious Behavior: No Danger to Others: No Physical Aggression / Violence: No Duty to Warn: No Access to Firearms a concern: No  Assessment of progress:  progressing  Diagnosis:   ICD-10-CM   1. Generalized anxiety disorder  F41.1     2. Adjustment disorder with mixed anxiety and depressed mood  F43.23     3. Relationship problem between parent and child  Z62.820     4. Relationship problem between partners  Z63.0     5. 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    G89.29      Plan:  Family conflict Note tendency to dwell on what's wrong/unfair about others' behavior and  work toward setting Tonya Yang own limits and asking what people want more than calling it out in blame Try to limit telling people off, focus on refusing unfair requirements and asserting Tonya Yang wishes without framing them as demands or "should haves" Look for friendlier behavior to reward somehow, with explicit thanks or just more of the kind of time Tonya Yang counterpart wants ("stealth behavior therapy") Watch tendency to proselytize, when empathizing and example will reach  better When tempted to outrage with family, try to clarify wishes before responding OK to pursue reconciliation with father.  Be mindful of tendency to preach and be mistaken for judgmental, try to center more on Tonya Yang own care, concern, and wishes for him and with him.  Try to bypass any grudging feelings toward Tonya Yang and, more than any blame, seek Tonya Yang's courage to connect. If useful, open to including Tonya Yang to help with domestic conflict Anxiety/anger management Use relaxing breathing, any method available, to reduce overall tension/anxiety/arousal Practice acknowledgment when frustrated -- recognize when body is trying to say it for Tonya Yang and acknowledge openly or privately instead Try to allow for physical reactions to stress, trust them to return to normal after working the (dreaded) problem Self-affirm when reasonable actions have been taken and it's time to let others respond Trust dreams as emotional brain working out unfinished emotions, not sign of deterioration Consider further assessing inflammatory response and treatment to ton down negative emotional intensity and pain issues Pain and anti-bruxism Continue habit reversal technique for jaw tension (tongue-thrusting, blowing gently and slowly in place of clenching) Option focused muscle relaxation -- relax thoroughly when tempted to clench, imagine tongue very heavy Mother's care dilemma Either alone or with family help, research the forms, costs, and process for elder care to begin framing the alternative to intolerably taking Tonya Yang in Brainstorm ways to tell Tonya Yang no, but I will ___ instead.  Option to break tension by imagining the worst ways to tell Tonya Yang, not just "good" ones.  Continue broaching the subject of looking at rational alternatives including the spectrum of care from in-home assistance to facility-based care, including independent living with peers. Other recommendations/advice -- As may be noted above.  Continue to utilize previously  learned skills ad lib. Medication compliance -- Maintain medication as prescribed and work faithfully with relevant prescriber(s) if any changes are desired or seem indicated. Crisis service -- Aware of call list and work-in appts.  Call the clinic on-call service, 988/hotline, 911, or present to [facility] or ER if any life-threatening psychiatric crisis. Followup -- Return for time as already scheduled.  Next scheduled visit with me 08/10/2023.  Next scheduled in this office 08/10/2023.  Robley Fries, PhD Tonya Czar, PhD LP Clinical Psychologist, [name] [address] [place], [place] [address] [phone]

## 2023-08-10 ENCOUNTER — Ambulatory Visit (INDEPENDENT_AMBULATORY_CARE_PROVIDER_SITE_OTHER): Payer: Commercial Managed Care - PPO | Admitting: Psychiatry

## 2023-08-10 DIAGNOSIS — Z62820 Parent-biological child conflict: Secondary | ICD-10-CM

## 2023-08-10 DIAGNOSIS — F411 Generalized anxiety disorder: Secondary | ICD-10-CM

## 2023-08-10 DIAGNOSIS — Z638 Other specified problems related to primary support group: Secondary | ICD-10-CM

## 2023-08-10 DIAGNOSIS — F4323 Adjustment disorder with mixed anxiety and depressed mood: Secondary | ICD-10-CM | POA: Diagnosis not present

## 2023-08-10 DIAGNOSIS — G8929 Other chronic pain: Secondary | ICD-10-CM

## 2023-08-10 NOTE — Progress Notes (Signed)
Psychotherapy Progress Note [name] Tonya Czar, PhD LP  Patient ID: Tonya Yang. Tonya Yang)    MRN: [id number] Therapy format: Individual psychotherapy Date: 08/10/2023      Start: 1:09p     Stop: 1:59p     Time Spent: 50 min Location: In-person   Session narrative (presenting needs, interim history, self-report of stressors and symptoms, applications of prior therapy, status changes, and interventions made in session) Curious about CBD, discussed parameters and known issues.  Would like some more help for anxiety and pain, knows it could help but very cautious about purity, quality, legality, and addiction potential.  BIL Tonya Yang also part of the backdrop, given his various various addict behaviors as a daily/chronic pot smoker.  Tonya Yang, too, as she does the same now.  Irritating history of crass comments and defensiveness with both of them.  Also of some concern that Tonya Yang has through her own psychosis before on pot, had to go on an antipsychotic and was dx'd Bipolar like Tonya Yang.  And she tried to get Tonya Yang onto CBD without medical advice.  Agreed the behaviors are all very legitimately concerning and unwise.  Possibility that Tonya Yang was on high-strength THC, but also that she is mor susceptible with a bipolar mother.  In any event, CBD does seem contraindicated for them, but could be helpful to Pt provided she can settle anxieties about sourcing, supply, and effects and try it with medical supervision and assistance.  THC, obviously not, just as likely to have a paranoid experience as anything, which would not be damaging, per se, but would tend to set her back in her anxiety disorder.  Re. Tonya Yang, has decided further not to press the case of being in touch.  Debatable whether he'Tonya Yang turned cold or is more or less bullied by current wife into walling off th women in his first family, but clear yet that it can be up to him, and she has fulfilled her duty to sow the sed of salvation.     Otherwise reports having vivid, apocalyptic dreams of late.  Discussed likely reasons, validated that they do seem to be not nightmares but emotional working through, and with her own actions being pretty effective and in-dream feelings nondistressed, they say more about having her agency, not actually being overwhelmed, as she faces a slate of responsibilities in her waking life.  Therapeutic modalities: Cognitive Behavioral Therapy, Solution-Oriented/Positive Psychology, Environmental manager, and Faith-sensitive  Mental Status/Observations:  Appearance:   Casual     Behavior:  Appropriate  Motor:  Normal  Speech/Language:   Clear and Coherent  Affect:  Appropriate  Mood:  Stable anxiety, generally managed  Thought process:  normal  Thought content:    WNL  Sensory/Perceptual disturbances:    WNL  Orientation:  Fully oriented  Attention:  Good    Concentration:  Good  Memory:  WNL  Insight:    Good  Judgment:   Good  Impulse Control:  Good   Risk Assessment: Danger to Self: No Self-injurious Behavior: No Danger to Others: No Physical Aggression / Violence: No Duty to Warn: No Access to Firearms a concern: No  Assessment of progress:  progressing  Diagnosis:   ICD-10-CM   1. Generalized anxiety disorder  F41.1     2. Relationship problem with family member  Z63.8     3. 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 Adjustment disorder with mixed anxiety  and depressed mood  F43.23     5. Relationship problem between parent and child  Z62.820      Plan:  Family conflict Note tendency to dwell on what'Tonya Yang wrong/unfair about others' behavior and work toward setting her own limits and asking what people want more than calling it out in blame Try to limit telling people off, focus on refusing unfair requirements and asserting her wishes without framing them as demands or "should haves" Look for friendlier behavior to reward somehow, with explicit  thanks or just more of the kind of time her counterpart wants ("stealth behavior therapy") Watch tendency to proselytize, when empathizing and example will reach better When tempted to outrage with family, try to clarify wishes before responding OK to pursue reconciliation with Tonya Yang.  Be mindful of tendency to preach and be mistaken for judgmental, try to center more on her own care, concern, and wishes for him and with him.  Try to bypass any grudging feelings toward Tonya Yang and, more than any blame, seek F'Tonya Yang courage to connect. If useful, open to including Tonya Yang to help with domestic conflict Anxiety/anger management Use relaxing breathing, any method available, to reduce overall tension/anxiety/arousal Practice acknowledgment when frustrated -- recognize when body is trying to say it for her and acknowledge openly or privately instead Try to allow for physical reactions to stress, trust them to return to normal after working the (dreaded) problem Self-affirm when reasonable actions have been taken and it'Tonya Yang time to let others respond Trust dreams as emotional brain working out unfinished emotions, not sign of deterioration or overwhelm unless frankly waking her up in panic Consider further assessing inflammatory response and treatment to ton down negative emotional intensity and pain issues Pain and anti-bruxism Continue habit reversal technique for jaw tension (tongue-thrusting, blowing gently and slowly in place of clenching) Option focused muscle relaxation -- relax thoroughly when tempted to clench, imagine tongue very heavy Mother'Tonya Yang care dilemma Either alone or with family help, research the forms, costs, and process for elder care to begin framing the alternative to intolerably taking Tonya Yang in Brainstorm ways to tell Tonya Yang no, but I will ___ instead.  Option to break tension by imagining the worst ways to tell her, not just "good" ones.  Continue broaching the subject of looking at rational  alternatives including the spectrum of care from in-home assistance to facility-based care, including independent living with peers. Other recommendations/advice -- As may be noted above.  Continue to utilize previously learned skills ad lib. Medication compliance -- Maintain medication as prescribed and work faithfully with relevant prescriber(Tonya Yang) if any changes are desired or seem indicated. Crisis service -- Aware of call list and work-in appts.  Call the clinic on-call service, 988/hotline, 911, or present to [facility] or ER if any life-threatening psychiatric crisis. Followup -- Return for time as already scheduled.  Next scheduled visit with me 08/24/2023.  Next scheduled in this office 08/24/2023.  Robley Fries, PhD Tonya Czar, PhD LP Clinical Psychologist, [name] [address] [place], [place] [address] [phone]

## 2023-08-21 NOTE — Progress Notes (Deleted)
62 y.o. G79P0000 Married Caucasian female here for annual exam.    PCP:     Patient's last menstrual period was 12/12/2014 (approximate).           Sexually active: {yes no:314532}  The current method of family planning is post menopausal status.    Exercising: {yes no:314532}  {types:19826} Smoker:  no  Health Maintenance: Pap:   01-21-19 ASCUS:Neg HR HPV, 11-17-17 Neg:Neg HR HPV, 11-12-14 Neg  History of abnormal Pap:  yes, 1990 hx possible cryotherapy MMG:  01/09/17 Breast Density Cat D, BI-RADS CAT 4 suspicious Colonoscopy:  05/2017 BMD:   07/19/21  Result  osteopenia TDaP:  11/19/18 Gardasil:   no HIV: 03/27/17 NR Hep C: 03/27/17 neg Screening Labs:  Hb today: ***, Urine today: ***   reports that she has never smoked. She has never used smokeless tobacco. She reports that she does not drink alcohol and does not use drugs.  Past Medical History:  Diagnosis Date   Abnormal Pap smear of cervix 1990   Posible cryotherapy.   Anxiety    Cancer (HCC) 03/02/2017   left breast cancer   Fibroids    HSV-1 infection    HTN (hypertension)    Melanoma (HCC)    left tricep   Osteopenia    TMJ (dislocation of temporomandibular joint)     Past Surgical History:  Procedure Laterality Date   BREAST SURGERY  03/02/2017   left lumpectomy--[facility]   MELANOMA EXCISION     left tricep   TRIGGER FINGER RELEASE Right 04/25/2018   right ring finger    Current Outpatient Medications  Medication Sig Dispense Refill   albuterol (VENTOLIN HFA) 108 (90 Base) MCG/ACT inhaler Inhale into the lungs.     buPROPion (WELLBUTRIN XL) 150 MG 24 hr tablet Take 1 tablet by mouth every morning.     clonazePAM (KLONOPIN) 0.5 MG tablet Take 0.25-0.5 mg by mouth at bedtime.     Diclofenac Sodium (PENNSAID) 2 % SOLN Apply 1 application topically as needed.     DULoxetine (CYMBALTA) 30 MG capsule Take 30 mg by mouth daily.     hydrOXYzine (ATARAX) 10 MG tablet Take 10 mg by mouth at bedtime as needed.      ketoconazole (NIZORAL) 2 % shampoo Apply topically.     melatonin 5 MG TABS Take by mouth.     minoxidil (LONITEN) 2.5 MG tablet Take 1.25 mg by mouth daily.     nebivolol (BYSTOLIC) 2.5 MG tablet Take 2.5 mg by mouth daily.     VITAMIN D, CHOLECALCIFEROL, PO Take by mouth. 5000 IUdaily     No current facility-administered medications for this visit.    Family History  Problem Relation Age of Onset   Breast cancer Mother        DCIS   Hypertension Mother    Diabetes Mother        borderline   Autoimmune disease Mother        giant cell arteritis   Thyroid disease Mother        hypothyrodism   Hypertension Father    Hypertension Sister    Thyroid disease Sister        hypothyroidism   Hypertension Brother     Review of Systems  Exam:   LMP 12/12/2014 (Approximate)     General appearance: alert, cooperative and appears stated age Head: normocephalic, without obvious abnormality, atraumatic Neck: no adenopathy, supple, symmetrical, trachea midline and thyroid normal to inspection and palpation Lungs: clear  to auscultation bilaterally Breasts: normal appearance, no masses or tenderness, No nipple retraction or dimpling, No nipple discharge or bleeding, No axillary adenopathy Heart: regular rate and rhythm Abdomen: soft, non-tender; no masses, no organomegaly Extremities: extremities normal, atraumatic, no cyanosis or edema Skin: skin color, texture, turgor normal. No rashes or lesions Lymph nodes: cervical, supraclavicular, and axillary nodes normal. Neurologic: grossly normal  Pelvic: External genitalia:  no lesions              No abnormal inguinal nodes palpated.              Urethra:  normal appearing urethra with no masses, tenderness or lesions              Bartholins and Skenes: normal                 Vagina: normal appearing vagina with normal color and discharge, no lesions              Cervix: no lesions              Pap taken: {yes no:314532} Bimanual Exam:   Uterus:  normal size, contour, position, consistency, mobility, non-tender              Adnexa: no mass, fullness, tenderness              Rectal exam: {yes no:314532}.  Confirms.              Anus:  normal sphincter tone, no lesions  Chaperone was present for exam:  ***  Assessment:   Well woman visit with gynecologic exam.   Plan: Mammogram screening discussed. Self breast awareness reviewed. Pap and HR HPV as above. Guidelines for Calcium, Vitamin D, regular exercise program including cardiovascular and weight bearing exercise.   Follow up annually and prn.   Additional counseling given.  {yes [id number]. _______ minutes face to face time of which over 50% was spent in counseling.    After visit summary provided.

## 2023-08-24 ENCOUNTER — Ambulatory Visit (INDEPENDENT_AMBULATORY_CARE_PROVIDER_SITE_OTHER): Payer: Commercial Managed Care - PPO | Admitting: Psychiatry

## 2023-08-24 DIAGNOSIS — G8929 Other chronic pain: Secondary | ICD-10-CM

## 2023-08-24 DIAGNOSIS — Z638 Other specified problems related to primary support group: Secondary | ICD-10-CM | POA: Diagnosis not present

## 2023-08-24 DIAGNOSIS — F411 Generalized anxiety disorder: Secondary | ICD-10-CM

## 2023-08-24 DIAGNOSIS — F4323 Adjustment disorder with mixed anxiety and depressed mood: Secondary | ICD-10-CM | POA: Diagnosis not present

## 2023-08-24 DIAGNOSIS — Z853 Personal history of malignant neoplasm of breast: Secondary | ICD-10-CM

## 2023-08-24 DIAGNOSIS — Z8639 Personal history of other endocrine, nutritional and metabolic disease: Secondary | ICD-10-CM

## 2023-08-24 DIAGNOSIS — Z62820 Parent-biological child conflict: Secondary | ICD-10-CM

## 2023-08-24 NOTE — Progress Notes (Signed)
Psychotherapy Progress Note [name] [name], PhD LP  Patient ID: Tonya Yang Tonya Yang)    MRN: [id number] Therapy format: Individual psychotherapy Date: 08/24/2023      Start: 1:12p     Stop: 2:02p     Time Spent: 50 min Location: In-person   Session narrative (presenting needs, interim history, self-report of stressors and symptoms, applications of prior therapy, status changes, and interventions made in session) On request, discussed telehealth procedures, based on Pt's sensitivities about privacy.  Prompted by recent 3rd separate letter received about her medical record being compromised.  Apparently each of them are related to a specific issue with an app that connected [employer] and MyChart several years ago.  Imagination running some about what data would have been obtained and whether it could lead to identity theft, financial harm, and suggestions she worries about emotional invasion and exposure to shaming or extortion.  Assured more likely it is demographics and contact info rather than claims and very unlikely clinical information, and more likely the breach was looking for SSN and enough personal info to try to impersonate people applying for fraudulent credit than anything personally targeting her for shame or exposure.  That, and clinical notes take further digging to get to personal life information with layers of privacy and security inside EHR.  Far more likely seeking financial access or sale of names and contact data for unscrupulous marketing.  Last session was discussing vivid, often apocalyptic dreams she'd been having.  Notes her pattern has been natural disasters, usually, and not personally alarmed for her safety, often trying to rescue other people.  One recently where she and F were at a stadium, likely [employer] football, where she asked him what he thought would happen, and he non sequitured adamantly for the last time he doesn't believe in God  and won't be swayed.  Discussed meaning, settling on how it was making peace -- either internally or God-inspired -- with her inability to reach him with concern for his salvation, perhaps getting a divine message it is not her burden.  Looking back, she felt almost mercilessly guilted by mother for trying to continue associating with F through their divorce and beyond.  Thankfully not happening now.  Had neurologist visit for lumbar stenosis and have agreed she won't go on any dulling meds, but she will accept a spinal injection.  Currently delayed by need to help H attend to his F, who needs surgery for bladder cancer.    Review of EHR after notes issues likely to affect body image and motional regulation including breast cancer with lumpectomy, hx of estrogen blocking medication, and diagnosed estrogen deficiency.  Worth following up on her experience of these and whether they play an appreciable role in anxiety and alienation.  Therapeutic modalities: Cognitive Behavioral Therapy, Solution-Oriented/Positive Psychology, Environmental manager, and Faith-sensitive  Mental Status/Observations:  Appearance:   Casual     Behavior:  Appropriate  Motor:  Normal  Speech/Language:   Clear and Coherent  Affect:  Appropriate  Mood:  anxious  Thought process:  normal  Thought content:    WNL and worry  Sensory/Perceptual disturbances:    WNL  Orientation:  Fully oriented  Attention:  Good    Concentration:  Good  Memory:  WNL  Insight:    Variable  Judgment:   Good  Impulse Control:  Good   Risk Assessment: Danger to Self: No Self-injurious Behavior: No Danger to Others: No Physical Aggression / Violence: No Duty to  Warn: No Access to Firearms a concern: No  Assessment of progress:  progressing  Diagnosis:   ICD-10-CM   1. Generalized anxiety disorder  F41.1     2. Adjustment disorder with mixed anxiety and depressed mood  F43.23     3. Relationship problem with family member  Z63.8     4.  Relationship problem between parent and child  Z62.820     5. 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 Hx of breast cancer, with hx of estrogen blocking therapy  Z85.3     7. 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)  Z86.39      Plan:  Family conflict Note tendency to dwell on what's wrong/unfair about others' behavior and work toward setting her own limits and asking what people want more than calling it out in blame Try to limit telling people off, focus on refusing unfair requirements and asserting her wishes without framing them as demands or "should haves" Look for friendlier behavior to reward somehow, with explicit thanks or just more of the kind of time her counterpart wants ("stealth behavior therapy") Watch tendency to proselytize, when empathizing and example will reach better When tempted to outrage with family, try to clarify wishes before responding OK to pursue reconciliation with father.  Be mindful of tendency to preach and be mistaken for judgmental, try to center more on her own care, concern, and wishes for him and with him.  Try to bypass any grudging feelings toward [name] and, more than any blame, seek F's courage to connect. If useful, open to including [name] to help with domestic conflict Anxiety/anger management Use relaxing breathing, any method available, to reduce overall tension/anxiety/arousal Practice acknowledgment when frustrated -- recognize when body is trying to say it for her and acknowledge openly or privately instead Try to allow for physical reactions to stress, trust them to return to normal after working the (dreaded) problem Self-affirm when reasonable actions have been taken and it's time to let others respond Trust dreams as emotional brain working out unfinished emotions, not sign of deterioration or overwhelm unless frankly waking her up in panic Health issues and pain TMJ/Anti-bruxism --  Continue habit reversal technique for jaw tension (tongue-thrusting, blowing gently and slowly in place of clenching).  Option focused muscle relaxation -- relax thoroughly when tempted to clench, imagine tongue very heavy. Lumbar stenosis -- Concur with topical and oral antiinflammatory strategies, injection offered.  Concur with likely adverse effects of opioid medication, but may do genetic testing to guide the choice if needed. Genetic testing -- recommended to help guide psychiatric medication choices, understand if she cannot stomach the perceived privacy risks Inflammatory response -- Consider further assessing inflammatory response and treatment to tone down negative emotional intensity and pain issues Mother's care dilemma Either alone or with family help, research the forms, costs, and process for elder care to begin framing the alternative to intolerably taking M in Brainstorm ways to tell M no, but I will ___ instead.  Option to break tension by imagining the worst ways to tell her, not just "good" ones.  Continue broaching the subject of looking at rational alternatives including the spectrum of care from in-home assistance to facility-based care, including independent living with peers. Other recommendations/advice -- As may be noted above.  Continue to utilize previously learned skills ad lib. Medication compliance -- Maintain medication as prescribed and work faithfully with relevant  prescriber(s) if any changes are desired or seem indicated. Crisis service -- Aware of call list and work-in appts.  Call the clinic on-call service, 988/hotline, 911, or present to [facility] or ER if any life-threatening psychiatric crisis. Followup -- Return for time as already scheduled.  Next scheduled visit with me 08/29/2023.  Next scheduled in this office 08/29/2023.  Robley Fries, PhD [name], PhD LP Clinical Psychologist, [name] [address] [place], [place] [address] [phone]

## 2023-08-29 ENCOUNTER — Ambulatory Visit: Payer: Commercial Managed Care - PPO | Admitting: Psychiatry

## 2023-09-04 ENCOUNTER — Ambulatory Visit: Payer: Commercial Managed Care - PPO | Admitting: Obstetrics and Gynecology

## 2023-09-15 ENCOUNTER — Ambulatory Visit: Payer: Commercial Managed Care - PPO | Admitting: Psychiatry

## 2023-09-20 ENCOUNTER — Ambulatory Visit: Payer: Commercial Managed Care - PPO | Admitting: Psychiatry

## 2023-09-22 ENCOUNTER — Ambulatory Visit (INDEPENDENT_AMBULATORY_CARE_PROVIDER_SITE_OTHER): Payer: Self-pay | Admitting: Psychiatry

## 2023-09-22 DIAGNOSIS — Z91199 Patient's noncompliance with other medical treatment and regimen due to unspecified reason: Secondary | ICD-10-CM

## 2023-09-22 NOTE — Progress Notes (Signed)
Admin note for non-service contact  Patient ID: Shelle Galdamez  MRN: [id number] DATE: 09/22/2023  Noshow for scheduled 2p.  Message from Pt timed 1p retrieved by staff 2:45p noting she was traveling to see her mother, was unsure of having an appt today, very apologetic, insists on being charged appropriately.  Reduced NS fee applied in light of her prompt history and effort getting in touch.  Per EHR, full skin exam since last seen ([facility]) with benign angioma and 1 noted watch issue, given hx of early-detected melanoma and reassurances noted.  Also orthopedic exam ([facility]) for right knee pain with no structural issues on xray, PT eval ([facility]) noting suggested radicular cause and responsive to meloxicam, and bone density ([facility]) finding of reduced bone density in lumbar spine and hip responding to treatment.  PT plan noted.  Robley Fries, PhD [name], PhD LP Clinical Psychologist, [name] [address] [place], [place] [address] [phone]

## 2023-09-28 ENCOUNTER — Ambulatory Visit: Payer: Commercial Managed Care - PPO | Admitting: Psychiatry

## 2023-10-06 ENCOUNTER — Ambulatory Visit (INDEPENDENT_AMBULATORY_CARE_PROVIDER_SITE_OTHER): Payer: Commercial Managed Care - PPO | Admitting: Psychiatry

## 2023-10-06 DIAGNOSIS — F411 Generalized anxiety disorder: Secondary | ICD-10-CM

## 2023-10-06 DIAGNOSIS — G8929 Other chronic pain: Secondary | ICD-10-CM

## 2023-10-06 DIAGNOSIS — Z853 Personal history of malignant neoplasm of breast: Secondary | ICD-10-CM | POA: Diagnosis not present

## 2023-10-06 DIAGNOSIS — Z62820 Parent-biological child conflict: Secondary | ICD-10-CM

## 2023-10-06 DIAGNOSIS — Z630 Problems in relationship with spouse or partner: Secondary | ICD-10-CM | POA: Diagnosis not present

## 2023-10-06 NOTE — Progress Notes (Unsigned)
Psychotherapy Progress Note [name] [name], PhD LP  Patient ID: Tonya Yang [facility])    MRN: [id number] Therapy format: Individual psychotherapy Date: 10/06/2023      Start: 2:10p     Stop: ***:***     Time Spent: *** min Location: In-person   Session narrative (presenting needs, interim history, self-report of stressors and symptoms, applications of prior therapy, status changes, and interventions made in session) [place] in [place] time, jokingly professes jet lag.  Had some fill of dysfuncitonal behavior by MIL in [place].  Looking for a workable light alarm.  Oriented today to orange lenses as a natural assist delivering melatonin.    Refreshed hx of trichotillomania, plausibly interpreted as how she handled tension of an emotionally volatile household, or more properly the uncerainty of calm or undirected moments, and how Xanax for something else thoroughly relieved it.    Been dealing more lately with irritation at [facility], and 2 weeks ago came to the point of packing to leave him.  C/o him using a mean tone and refusing to take feedback, make changes on it.  Suspects he might be autistic spectrum, knows he has family hx raised in [place], large family, mother treated him coolly trying to prevent seeming like she was playing favorites vs his 2 half brothers.  In this incident -- which is emblematic --   Did in fact drive away and eventually reach a motel in [facility].    Therapeutic modalities: {AM:23362::"Cognitive Behavioral Therapy","Solution-Oriented/Positive Psychology"}  Mental Status/Observations:  Appearance:   {PSY:22683}     Behavior:  {PSY:21022743}  Motor:  {PSY:22302}  Speech/Language:   {PSY:22685}  Affect:  {PSY:22687}  Mood:  {PSY:31886}  Thought process:  {PSY:31888}  Thought content:    {PSY:[phone]}  Sensory/Perceptual disturbances:    {PSY:[phone]}  Orientation:  {Psych Orientation:23301::"Fully oriented"}   Attention:  {Good-Fair-Poor ratings:23770::"Good"}    Concentration:  {Good-Fair-Poor ratings:23770::"Good"}  Memory:  {PSY:[phone]}  Insight:    {Good-Fair-Poor ratings:23770::"Good"}  Judgment:   {Good-Fair-Poor ratings:23770::"Good"}  Impulse Control:  {Good-Fair-Poor ratings:23770::"Good"}   Risk Assessment: Danger to Self: {Risk:22599::"No"} Self-injurious Behavior: {Risk:22599::"No"} Danger to Others: {Risk:22599::"No"} Physical Aggression / Violence: {Risk:22599::"No"} Duty to Warn: {AMYesNo:22526::"No"} Access to Firearms a concern: {AMYesNo:22526::"No"}  Assessment of progress:  {Progress:22147::"progressing"}  Diagnosis: No diagnosis found. Plan:  *** Other recommendations/advice -- As may be noted above.  Continue to utilize previously learned skills ad lib. Medication compliance -- Maintain medication as prescribed and work faithfully with relevant prescriber(s) if any changes are desired or seem indicated. Crisis service -- Aware of call list and work-in appts.  Call the clinic on-call service, 988/hotline, 911, or present to [facility] or ER if any life-threatening psychiatric crisis. Followup -- No follow-ups on file.  Next scheduled visit with me 10/20/2023.  Next scheduled in this office 10/20/2023.  Robley Fries, PhD [name], PhD LP Clinical Psychologist, [name] [address] [place], [place] [address] [phone]

## 2023-10-13 ENCOUNTER — Ambulatory Visit: Payer: Commercial Managed Care - PPO | Admitting: Psychiatry

## 2023-10-20 ENCOUNTER — Ambulatory Visit: Payer: Commercial Managed Care - PPO | Admitting: Psychiatry

## 2023-10-27 ENCOUNTER — Ambulatory Visit: Payer: Commercial Managed Care - PPO | Admitting: Psychiatry

## 2023-11-17 ENCOUNTER — Ambulatory Visit (INDEPENDENT_AMBULATORY_CARE_PROVIDER_SITE_OTHER): Payer: Commercial Managed Care - PPO | Admitting: Psychiatry

## 2023-11-17 DIAGNOSIS — Z62820 Parent-biological child conflict: Secondary | ICD-10-CM

## 2023-11-17 DIAGNOSIS — F411 Generalized anxiety disorder: Secondary | ICD-10-CM

## 2023-11-17 DIAGNOSIS — Z630 Problems in relationship with spouse or partner: Secondary | ICD-10-CM

## 2023-11-17 DIAGNOSIS — G8929 Other chronic pain: Secondary | ICD-10-CM

## 2023-11-17 DIAGNOSIS — Z636 Dependent relative needing care at home: Secondary | ICD-10-CM

## 2023-11-17 NOTE — Progress Notes (Unsigned)
Psychotherapy Progress Note [name] [name], PhD LP  Patient ID: Tonya Yang [facility])    MRN: [id number] Therapy format: Individual psychotherapy Date: 11/17/2023      Start: 2:19p     Stop: 3:24p     Time Spent: 65 min Location: In-person   Session narrative (presenting needs, interim history, self-report of stressors and symptoms, applications of prior therapy, status changes, and interventions made in session) Glad to be back after being unable to schedule several weeks.  As related in October, had gotten to the point of boycotting a long trip with [name] over him invalidating offenses taken.  Renewed c/o him being gruff, reflexively defensive when confronted, and how she left for 2 weeks mid October.  After return, got a tentative agreement to   Eventually acknowledges that the past 4 weeks or so have been a good bit calmer, since she showed quiet (and foreboding tears).  Meanwhile, mother seems to be having new delusions -- now of several types of invisible bugs.  Seems to be warming up another manic episode.  No weapons, but concerns she could escalate.  In October she was sleeping all day, hard to rouse; now she's sleeping days and up nights.  Is in touch with [name] PCP to inform, knows   Therapeutic modalities: {AM:23362::"Cognitive Behavioral Therapy","Solution-Oriented/Positive Psychology"}  Mental Status/Observations:  Appearance:   {PSY:22683}     Behavior:  {PSY:21022743}  Motor:  {PSY:22302}  Speech/Language:   {PSY:22685}  Affect:  {PSY:22687}  Mood:  {PSY:31886}  Thought process:  {PSY:31888}  Thought content:    {[phone]}  Sensory/Perceptual disturbances:    {[phone]}  Orientation:  {Psych Orientation:23301::"Fully oriented"}  Attention:  {Good-Fair-Poor ratings:23770::"Good"}    Concentration:  {Good-Fair-Poor ratings:23770::"Good"}  Memory:  {PSY:[phone]}  Insight:    {Good-Fair-Poor ratings:23770::"Good"}   Judgment:   {Good-Fair-Poor ratings:23770::"Good"}  Impulse Control:  {Good-Fair-Poor ratings:23770::"Good"}   Risk Assessment: Danger to Self: {Risk:22599::"No"} Self-injurious Behavior: {Risk:22599::"No"} Danger to Others: {Risk:22599::"No"} Physical Aggression / Violence: {Risk:22599::"No"} Duty to Warn: {AMYesNo:22526::"No"} Access to Firearms a concern: {AMYesNo:22526::"No"}  Assessment of progress:  {Progress:22147::"progressing"}  Diagnosis: No diagnosis found. Plan:  *** Other recommendations/advice -- As may be noted above.  Continue to utilize previously learned skills ad lib. Medication compliance -- Maintain medication as prescribed and work faithfully with relevant prescriber(s) if any changes are desired or seem indicated. Crisis service -- Aware of call list and work-in appts.  Call the clinic on-call service, 988/hotline, 911, or present to [facility] or ER if any life-threatening psychiatric crisis. Followup -- No follow-ups on file.  Next scheduled visit with me 12/01/2023.  Next scheduled in this office 12/01/2023.  Robley Fries, PhD [name], PhD LP Clinical Psychologist, [name] [address] [place], [place] [address] [phone]

## 2023-12-01 ENCOUNTER — Ambulatory Visit (INDEPENDENT_AMBULATORY_CARE_PROVIDER_SITE_OTHER): Payer: Commercial Managed Care - PPO | Admitting: Psychiatry

## 2023-12-01 DIAGNOSIS — G8929 Other chronic pain: Secondary | ICD-10-CM

## 2023-12-01 DIAGNOSIS — Z630 Problems in relationship with spouse or partner: Secondary | ICD-10-CM

## 2023-12-01 DIAGNOSIS — Z62820 Parent-biological child conflict: Secondary | ICD-10-CM

## 2023-12-01 DIAGNOSIS — Z636 Dependent relative needing care at home: Secondary | ICD-10-CM | POA: Diagnosis not present

## 2023-12-01 DIAGNOSIS — F411 Generalized anxiety disorder: Secondary | ICD-10-CM | POA: Diagnosis not present

## 2023-12-01 NOTE — Progress Notes (Unsigned)
Psychotherapy Progress Note [name] [name], PhD LP  Patient ID: Justyne Bartlette [facility])    MRN: [id number] Therapy format: Individual psychotherapy Date: 12/01/2023      Start: 11:15a     Stop: ***:***     Time Spent: *** min Location: In-person   Session narrative (presenting needs, interim history, self-report of stressors and symptoms, applications of prior therapy, status changes, and interventions made in session) Issues with mother, father, and husband all right now.  First [name] -- has some indication he's taking her discontent seriously since she took a leave, and since he saw her come back to tears.  No confidence he can see it through, or be willing to seek help, being proud and something of an adrenaline junkie who is on Adderall.  Recent progress (say, 5 yrs) getting him to stop growling at her when frustrated.  Hx of double messages about whether to have children, and irritation over him revising history, and making unilateral decisions to move or change jobs without respect of her interests.  Repeated c/o him denying and projecting when faced with speaking harshly.  Recounts stories of being disregardedd, eventually has it out that he   Re mother, very helpful to be reassured last session she's not committable condition, so not an emergency.  Relatively short-lived, as M called her in distress about her cards being stolen by an intruder (lost), illustrating more potent delusions of intruders.  Unfortunately, [name] has had a tendency to fire off interpretations (e.g., M lying/manipulating) before understanding, and forfeiting a comforting moment (after   Re F, still getting terse replies to text messages, had an impulse to gift him (flannel lined pants) before Thanksgiving.  Trying not to be wounded by it, including cranky-sounding messages about misdelivery and wrong size.    Therapeutic modalities: {AM:23362::"Cognitive Behavioral  Therapy","Solution-Oriented/Positive Psychology"}  Mental Status/Observations:  Appearance:   {PSY:22683}     Behavior:  {PSY:21022743}  Motor:  {PSY:22302}  Speech/Language:   {PSY:22685}  Affect:  {PSY:22687}  Mood:  {PSY:31886}  Thought process:  {PSY:31888}  Thought content:    {PSY:6311289735}  Sensory/Perceptual disturbances:    {PSY:661-268-6906}  Orientation:  {Psych Orientation:23301::"Fully oriented"}  Attention:  {Good-Fair-Poor ratings:23770::"Good"}    Concentration:  {Good-Fair-Poor ratings:23770::"Good"}  Memory:  {PSY:606-313-2389}  Insight:    {Good-Fair-Poor ratings:23770::"Good"}  Judgment:   {Good-Fair-Poor ratings:23770::"Good"}  Impulse Control:  {Good-Fair-Poor ratings:23770::"Good"}   Risk Assessment: Danger to Self: {Risk:22599::"No"} Self-injurious Behavior: {Risk:22599::"No"} Danger to Others: {Risk:22599::"No"} Physical Aggression / Violence: {Risk:22599::"No"} Duty to Warn: {AMYesNo:22526::"No"} Access to Firearms a concern: {AMYesNo:22526::"No"}  Assessment of progress:  {Progress:22147::"progressing"}  Diagnosis: No diagnosis found. Plan:  *** Other recommendations/advice -- As may be noted above.  Continue to utilize previously learned skills ad lib. Medication compliance -- Maintain medication as prescribed and work faithfully with relevant prescriber(s) if any changes are desired or seem indicated. Crisis service -- Aware of call list and work-in appts.  Call the clinic on-call service, 988/hotline, 911, or present to [facility] or ER if any life-threatening psychiatric crisis. Followup -- No follow-ups on file.  Next scheduled visit with me 12/15/2023.  Next scheduled in this office 12/15/2023.  Robley Fries, PhD [name], PhD LP Clinical Psychologist, [name] [address] [place], [place] [address] [phone]

## 2023-12-15 ENCOUNTER — Ambulatory Visit (INDEPENDENT_AMBULATORY_CARE_PROVIDER_SITE_OTHER): Payer: Commercial Managed Care - PPO | Admitting: Psychiatry

## 2023-12-15 DIAGNOSIS — Z630 Problems in relationship with spouse or partner: Secondary | ICD-10-CM | POA: Diagnosis not present

## 2023-12-15 DIAGNOSIS — F411 Generalized anxiety disorder: Secondary | ICD-10-CM

## 2023-12-15 DIAGNOSIS — Z636 Dependent relative needing care at home: Secondary | ICD-10-CM

## 2023-12-15 DIAGNOSIS — Z62820 Parent-biological child conflict: Secondary | ICD-10-CM | POA: Diagnosis not present
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
## 2023-12-22 ENCOUNTER — Ambulatory Visit (INDEPENDENT_AMBULATORY_CARE_PROVIDER_SITE_OTHER): Payer: Commercial Managed Care - PPO | Admitting: Psychiatry

## 2023-12-22 DIAGNOSIS — Z636 Dependent relative needing care at home: Secondary | ICD-10-CM

## 2023-12-22 DIAGNOSIS — Z62820 Parent-biological child conflict: Secondary | ICD-10-CM

## 2023-12-22 DIAGNOSIS — F411 Generalized anxiety disorder: Secondary | ICD-10-CM

## 2023-12-22 DIAGNOSIS — Z630 Problems in relationship with spouse or partner: Secondary | ICD-10-CM

## 2023-12-22 NOTE — Progress Notes (Signed)
 Psychotherapy Progress Note [name] Jodie Kendall, PhD LP  Patient ID: Tonya Yang [facility])    MRN: [id number] Therapy format: Individual psychotherapy Date: 12/22/2023      Start: 2:11p     Stop: 3:20p     Time Spent: 69 min Location: In-person   Session narrative (presenting needs, interim history, self-report of stressors and symptoms, applications of prior therapy, status changes, and interventions made in session) Some fun lately domesticating squirrels -- and admittedly, vice versa -- as well as a family of crows.  Enjoyed a video on dogs recently, though she found herself inconveniently tearful.  Encouraged no shame in being moved, she has plenty going on to move her, she is naturally very empathetic, and if she's not ready for another dog yet (10 yrs later), she's not ready, no requirement to feel up to it, just ready enough to take a chance loving another pet when she decides he's ready enough.  In a welcome development for [name] care, her new psychiatrist called for background.  Grateful for the opportunity to convey her problems with circadian discipline, hx of steroid-provoked manic psychosis, etc.  Knows he is giving her low-dose Seroquel.  Has taken a call today from her PCP, beginning concerns for her judgment and broaching the triggering topic of planning for M to stop living on her own.  Allowed that she will decline sometime, somehow, and contextualized as PCP doing a PCP's job when they don't fully know what else has happened or is under way, encouraged just let PCP know she's aware and on it but first priority to clarify dementia vs. more treatable elder psychosis, and she's in frequent touch, well aware, and willing to confer as it gets confirmed how/when she will need to relocate for care.  C/o eggshells with mother, who seems to have a lot of trigger words, being emotionally brittle, taking offense by surprise.  Validated how her ways wired up Lonell to  feel chronically ill at ease, responsible, and on call.  Illustrated carryover into other things like obsessing about whether she might have offended others, e.g., her Black neighbors.  Assured otherwise, support/empathy provided.   Therapeutic modalities: Cognitive Behavioral Therapy, Solution-Oriented/Positive Psychology, and Ego-Supportive  Mental Status/Observations:  Appearance:   Casual     Behavior:  Appropriate  Motor:  Normal  Speech/Language:   Clear and Coherent  Affect:  Appropriate  Mood:  anxious  Thought process:  normal  Thought content:    WNL  Sensory/Perceptual disturbances:    WNL  Orientation:  Fully oriented  Attention:  Good    Concentration:  Fair  Memory:  WNL  Insight:    Good  Judgment:   Good  Impulse Control:  Good   Risk Assessment: Danger to Self: No Self-injurious Behavior: No Danger to Others: No Physical Aggression / Violence: No Duty to Warn: No Access to Firearms a concern: No  Assessment of progress:  progressing  Diagnosis:   ICD-10-CM   1. Generalized anxiety disorder  F41.1     2. Relationship problem between partners  Z63.0     3. Caregiver stress  Z63.6     4. Relationship problem with parent x2  Z62.820      Plan:  Marital distress For best results, emphasize getting an agreement to deal with difficult subjects before presenting a case, and be willing to ask him what he hears before trying to argue harm done When possible, choose letting hurt show over anger -- better  chances of H's conscience getting through Monitor for any risk of flooding Alm -- understood to be somewhat on the spectrum -- and of unrealistic expectations for him to be suddenly empathetic, thoughtful, or gracious as she would try to be and accept as thoroughly as possible that even if she wants what she is good at under stress, he's different As able, recognize and reward improvements in behavior, same as in childrearing or animal training, and make sure  not to expect too evolved learning, lest it set up more resentment Separation is an option, but best acted on with notice rather than on impulse If useful, open to including VEAR Alm in therapy Family conflict in general Note tendency to dwell on what's wrong/unfair about others' behavior and work toward setting her own limits and asking what people want more than calling it out in blame Try to limit telling people off, focus on refusing unfair requirements and asserting her wishes without framing them as demands or should haves Look for friendlier behavior to reward somehow, with explicit thanks or just more of the kind of time her counterpart wants (stealth behavior therapy) Watch tendency to proselytize, when empathizing and example will reach better When tempted to outrage with family, try to clarify wishes before responding OK to pursue reconciliation with father.  Be mindful of tendency to preach and be mistaken for judgmental, try to center more on her own care, concern, and wishes for him and with him.  Try to bypass any grudging feelings toward [name] and, more than any blame, seek F's courage to connect. Anxiety/anger management Use relaxing breathing, any method available, to reduce overall tension/anxiety/arousal Practice acknowledgment when frustrated -- recognize when body is trying to say it for her and acknowledge openly or privately instead Try to allow for physical reactions to stress, trust them to return to normal after working the (dreaded) problem Self-affirm when reasonable actions have been taken and it's time to let others respond Trust dreams as emotional brain working out unfinished emotions, not sign of deterioration or overwhelm unless frankly waking her up in panic Health issues and pain TMJ/Anti-bruxism -- Continue habit reversal technique for jaw tension (tongue-thrusting, blowing gently and slowly in place of clenching).  Option focused muscle relaxation -- relax  thoroughly when tempted to clench, imagine tongue very heavy. Lumbar stenosis -- Concur with topical and oral antiinflammatory strategies, injection offered.  Concur with likely adverse effects of opioid medication, but may do genetic testing to guide the choice if needed. Genetic testing -- recommended to help guide psychiatric medication choices, understand if she cannot stomach the perceived privacy risks Inflammatory response -- Consider further assessing inflammatory response and treatment to tone down negative emotional intensity and pain issues Mother's care dilemma Monitor for more serious signs of mania, delirium, dementia, or other psychosis and activate connections with local help.  Option to ask for welfare check by local resources if concerning enough.  Otherwise, confer as actively as can work with her doctors and offer context. To prepare for eventual strain deciding some form of assisted living, research the forms, costs, and process for beginning in-home care and independent or assisted living ata home, primarily so they can all reach the subject armed with facts.  Self-affirm that it is OK to decline taking M in herself, provided a safe and adequate alternative can be presented, then M can decide to accept or buck it and Rolan stand ready to call in local services for welfare checks if she thinks  there may be a need. Brainstorm ways to tell M no to unreasonable requests and offer a reasonable alternative.  Option to break tension by paradoxically imagining the worst ways to tell her, not just the good or nice ones.  Continue broaching the subject at discretion of looking into rational alternatives to living with Arian, including the spectrum of care from in-home assistance to facility-based care to peer community living. Other recommendations/advice -- As may be noted above.  Continue to utilize previously learned skills ad lib. Medication compliance -- Maintain medication as prescribed  and work faithfully with relevant prescriber(s) if any changes are desired or seem indicated. Crisis service -- Aware of call list and work-in appts.  Call the clinic on-call service, 988/hotline, 911, or present to [facility] or ER if any life-threatening psychiatric crisis. Followup -- Return for time as already scheduled.  Next scheduled visit with me 12/28/2023.  Next scheduled in this office 12/28/2023.  Lamar Kendall, PhD Jodie Kendall, PhD LP Clinical Psychologist, [name] [address] [place], [place] [address] [phone]

## 2023-12-26 NOTE — Progress Notes (Deleted)
 63 y.o. G45P0000 Married Caucasian female here for annual exam.    PCP: Stevphen Rochester, MD   Patient's last menstrual period was 12/12/2014 (approximate).           Sexually active: No.  The current method of family planning is post menopausal status.    Menopausal hormone therapy:  n/a Exercising: {yes no:314532}  {types:19826} Smoker:  no  OB History  Gravida Para Term Preterm AB Living  0 0 0 0 0 0  SAB IAB Ectopic Multiple Live Births  0 0 0 0 0     HEALTH MAINTENANCE: Last 2 paps:  08/30/22 neg: HR HPV neg, 01/21/19 ASCUS: HR HPV neg History of abnormal Pap or positive HPV:  yes, 1990, possible cryo Mammogram: 12/21/23 BI-RADS CAT 2 benign Colonoscopy:  04/14/17 Bone Density:  09/08/23  Result normal  Immunization History  Administered Date(s) Administered   Influenza,inj,Quad PF,6+ Mos 09/23/2019   Janssen (J&J) SARS-COV-2 Vaccination 04/09/2020   Moderna Sars-Covid-2 Vaccination 01/21/2021   Tdap 11/19/2018      reports that she has never smoked. She has never used smokeless tobacco. She reports that she does not drink alcohol and does not use drugs.  Past Medical History:  Diagnosis Date   Abnormal Pap smear of cervix 1990   Posible cryotherapy.   Anxiety    Cancer (HCC) 03/02/2017   left breast cancer   Fibroids    HSV-1 infection    HTN (hypertension)    Melanoma (HCC)    left tricep   Osteopenia    TMJ (dislocation of temporomandibular joint)     Past Surgical History:  Procedure Laterality Date   BREAST SURGERY  03/02/2017   left lumpectomy--[facility]   MELANOMA EXCISION     left tricep   TRIGGER FINGER RELEASE Right 04/25/2018   right ring finger    Current Outpatient Medications  Medication Sig Dispense Refill   albuterol (VENTOLIN HFA) 108 (90 Base) MCG/ACT inhaler Inhale into the lungs.     buPROPion (WELLBUTRIN XL) 150 MG 24 hr tablet Take 1 tablet by mouth every morning.     clonazePAM (KLONOPIN) 0.5 MG tablet Take 0.25-0.5 mg by mouth  at bedtime.     Diclofenac Sodium (PENNSAID) 2 % SOLN Apply 1 application topically as needed.     DULoxetine (CYMBALTA) 30 MG capsule Take 30 mg by mouth daily.     hydrOXYzine (ATARAX) 10 MG tablet Take 10 mg by mouth at bedtime as needed.     ketoconazole (NIZORAL) 2 % shampoo Apply topically.     melatonin 5 MG TABS Take by mouth.     minoxidil (LONITEN) 2.5 MG tablet Take 1.25 mg by mouth daily.     nebivolol (BYSTOLIC) 2.5 MG tablet Take 2.5 mg by mouth daily.     VITAMIN D, CHOLECALCIFEROL, PO Take by mouth. 5000 IUdaily     No current facility-administered medications for this visit.    ALLERGIES: Niacin, Benzocaine, Epinephrine, Lanolin, Other, Paba derivatives, Statins, Nickel, Prilocaine, Procaine, and Tetracaine  Family History  Problem Relation Age of Onset   Breast cancer Mother        DCIS   Hypertension Mother    Diabetes Mother        borderline   Autoimmune disease Mother        giant cell arteritis   Thyroid disease Mother        hypothyrodism   Hypertension Father    Hypertension Sister    Thyroid disease  Sister        hypothyroidism   Hypertension Brother     Review of Systems  PHYSICAL EXAM:  LMP 12/12/2014 (Approximate)     General appearance: alert, cooperative and appears stated age Head: normocephalic, without obvious abnormality, atraumatic Neck: no adenopathy, supple, symmetrical, trachea midline and thyroid normal to inspection and palpation Lungs: clear to auscultation bilaterally Breasts: normal appearance, no masses or tenderness, No nipple retraction or dimpling, No nipple discharge or bleeding, No axillary adenopathy Heart: regular rate and rhythm Abdomen: soft, non-tender; no masses, no organomegaly Extremities: extremities normal, atraumatic, no cyanosis or edema Skin: skin color, texture, turgor normal. No rashes or lesions Lymph nodes: cervical, supraclavicular, and axillary nodes normal. Neurologic: grossly normal  Pelvic:  External genitalia:  no lesions              No abnormal inguinal nodes palpated.              Urethra:  normal appearing urethra with no masses, tenderness or lesions              Bartholins and Skenes: normal                 Vagina: normal appearing vagina with normal color and discharge, no lesions              Cervix: no lesions              Pap taken: {yes no:314532} Bimanual Exam:  Uterus:  normal size, contour, position, consistency, mobility, non-tender              Adnexa: no mass, fullness, tenderness              Rectal exam: {yes no:314532}.  Confirms.              Anus:  normal sphincter tone, no lesions  Chaperone was present for exam:  {BSCHAPERONE:31226::"Athziri Freundlich F, CMA"}  ASSESSMENT: Well woman visit with gynecologic exam  ***  PLAN: Mammogram screening discussed. Self breast awareness reviewed. Pap and HRV collected:  {yes no:314532} Guidelines for Calcium, Vitamin D, regular exercise program including cardiovascular and weight bearing exercise. Medication refills:  *** {LABS (Optional):23779} Follow up:  ***    Additional counseling given.  {yes [id number]. ***  total time was spent for this patient encounter, including preparation, face-to-face counseling with the patient, coordination of care, and documentation of the encounter in addition to doing the well woman visit with gynecologic exam.

## 2023-12-28 ENCOUNTER — Ambulatory Visit: Payer: Commercial Managed Care - PPO | Admitting: Psychiatry

## 2023-12-28 DIAGNOSIS — Z62820 Parent-biological child conflict: Secondary | ICD-10-CM | POA: Diagnosis not present

## 2023-12-28 DIAGNOSIS — F4323 Adjustment disorder with mixed anxiety and depressed mood: Secondary | ICD-10-CM | POA: Diagnosis not present

## 2023-12-28 DIAGNOSIS — Z630 Problems in relationship with spouse or partner: Secondary | ICD-10-CM | POA: Diagnosis not present

## 2023-12-28 DIAGNOSIS — F411 Generalized anxiety disorder: Secondary | ICD-10-CM

## 2023-12-28 DIAGNOSIS — Z636 Dependent relative needing care at home: Secondary | ICD-10-CM

## 2023-12-28 NOTE — Progress Notes (Signed)
Psychotherapy Progress Note [name] Tonya Czar, PhD LP  Patient ID: Tonya Yang [name]    MRN: [id number] Therapy format: Individual psychotherapy Date: 12/28/2023      Start: 2:15p     Stop: 3:05p     Time Spent: 50 min Location: In-person   Session narrative (presenting needs, interim history, self-report of stressors and symptoms, applications of prior therapy, status changes, and interventions made in session) Many agenda items today.  Re F -- last decided to let silence fall, and came home from [place] without visiting, 12/26 texted to let him know she'll send him a return label for pants he didn't want, got the pants back with no message this week, which felt vaguely condemning and irritated her.  Support/empathy provided, encouraged letting up on taking offense to things her father didn't use the courage to say outright, if he in fact meant them.  Deeply affected this year by Tonya Yang's suspected suicide.  Recently saw autopsy report that said it was natural causes, but suspected otherwise, as she has been known to suffer a serious mood disorder, made statements about what her children mean to her, and her son's death suspiciously preceded this.  Whatever the truth Tonya Yang has been moved again by finding a little know recording of her icon prepping a Tonya Yang, "I Believe in You".  Pt recalls nicest thing Tonya Yang ever said to her was that she thought Tonya could well be in heaven despite suicide.  Affirmed and encouraged in taking the optimistic view and validated benefit of her music.  At home, been feeling more like Tonya Yang is just a jerk, and that she's missed her opportunity in life. He's been more crabby and mocking of her, prompting more thoughts of possibly separating somehow.  Support/empathy provided, reiterated possibilities for more constructive communication.  Re. Mom's mental status, her PCP called to express concern whether she would  be able to live on her own, starting up a wave of anxiety and discomfort about getting sucked in asa caregiver and feeling arm-twisted into responsibility to manage her.  Has updated info from psychiatry assessing Tonya Yang as schizoaffective, while PCP thinks she is suddenly advanced dementia.  Discussed what to take from the diagnoses and sided with psychiatry, permission to take PCP with a grain of salt, being the less trained in relevant symptomatology.  As treatment goes, low dose Seroquel is not making her sleep as intended, largely for feeling the need to get up and address the bugs.  Discussed approach to coordinating with the psychiatrist, and likelihood that he will figure Tonya Yang out better.  Therapeutic modalities: Cognitive Behavioral Therapy, Solution-Oriented/Positive Psychology, and Ego-Supportive  Mental Status/Observations:  Appearance:   Casual     Behavior:  Appropriate  Motor:  Normal  Speech/Language:   Clear and Coherent  Affect:  Appropriate  Mood:  irritable and with subject  Thought process:  normal  Thought content:    WNL  Sensory/Perceptual disturbances:    WNL  Orientation:  Fully oriented  Attention:  Good    Concentration:  Good  Memory:  WNL  Insight:    Good  Judgment:   Good  Impulse Control:  Good   Risk Assessment: Danger to Self: No Self-injurious Behavior: No Danger to Others: No Physical Aggression / Violence: No Duty to Warn: No Access to Firearms a concern: No  Assessment of progress:  stabilized  Diagnosis:   ICD-10-CM   1. Generalized anxiety disorder  F41.1  2. Adjustment disorder with mixed anxiety and depressed mood  F43.23     3. Relationship problem between partners  Z63.0     4. Relationship problem with parent x2  Z62.820     5. Caregiver stress  Z63.6      Plan:  Marital distress For best results, emphasize getting an agreement to deal with difficult subjects before presenting a case, and be willing to ask him what he hears before  trying to argue harm done When possible, choose letting hurt show over anger -- better chances of H's conscience getting through Monitor for any risk of flooding Tonya Yang -- understood to be somewhat on the spectrum -- and of unrealistic expectations for him to be suddenly empathetic, thoughtful, or gracious as she would try to be and accept as thoroughly as possible that even if she wants what she is good at under stress, he's different As able, recognize and reward improvements in behavior, same as in childrearing or animal training, and make sure not to expect too evolved learning, lest it set up more resentment Separation is an option, but best acted on with notice rather than on impulse If useful, open to including Tonya Yang in therapy Family conflict in general Note tendency to dwell on what's wrong/unfair about others' behavior and work toward setting her own limits and asking what people want more than calling it out in blame Try to limit telling people off, focus on refusing unfair requirements and asserting her wishes without framing them as demands or "should haves" Look for friendlier behavior to reward somehow, with explicit thanks or just more of the kind of time her counterpart wants ("stealth behavior therapy") Watch tendency to proselytize, when empathizing and example will reach better When tempted to outrage with family, try to clarify wishes before responding OK to pursue reconciliation with father or let it lie.  If trying, be mindful of the drawbacks of any tendency to preach or proselytize, and to be mistaken for judgmental, and try to center her message more on her own care and concern for him, and wishes with him.  Try to bypass any grudging feelings toward Tonya Yang and emphasize seeking F's courage to connect over any inclination to blame her as manipulating him. Anxiety/anger management Use relaxing breathing, any method available, to reduce overall tension/anxiety/arousal Practice  acknowledgment when frustrated -- recognize when body is trying to say it for her and acknowledge openly or privately instead Try to allow for physical reactions to stress, trust them to return to normal after working the (dreaded) problem Self-affirm when reasonable actions have been taken and it's time to let others respond Trust dreams as emotional brain working out unfinished emotions, not sign of deterioration or overwhelm unless frankly waking her up in panic Health issues and pain TMJ/Anti-bruxism -- Continue habit reversal technique for jaw tension (tongue-thrusting, blowing gently and slowly in place of clenching).  Option focused muscle relaxation -- relax thoroughly when tempted to clench, imagine tongue very heavy. Lumbar stenosis -- Concur with topical and oral antiinflammatory strategies, injection offered.  Concur with likely adverse effects of opioid medication, but may do genetic testing to guide the choice if needed. Genetic testing -- recommended to help guide psychiatric medication choices, understand if she cannot stomach the perceived privacy risks Inflammatory response -- Consider further assessing inflammatory response and treatment to tone down negative emotional intensity and pain issues Mother's care dilemma Monitor for more serious signs of mania, delirium, dementia, or other psychosis and activate  connections with local help.  Option to ask for welfare check by local resources if concerning enough.  Otherwise, confer as actively as can work with her doctors and offer context. To prepare for eventual strain deciding some form of assisted living, research the forms, costs, and process for beginning in-home care and independent or assisted living ata home, primarily so they can all reach the subject armed with facts.  Self-affirm that it is OK to decline taking Tonya Yang in herself, provided a safe and adequate alternative can be presented, then Tonya Yang can decide to accept or buck it and Jesusita Oka  stand ready to call in local services for welfare checks if she thinks there may be a need. Brainstorm ways to tell Tonya Yang no to unreasonable requests and offer a reasonable alternative.  Option to break tension by paradoxically imagining the worst ways to tell her, not just the "good" or "nice" ones.  Continue broaching the subject at discretion of looking into rational alternatives to living with Annabelle Harman, including the spectrum of care from in-home assistance to facility-based care to peer community living. Other recommendations/advice -- As may be noted above.  Continue to utilize previously learned skills ad lib. Medication compliance -- Maintain medication as prescribed and work faithfully with relevant prescriber(s) if any changes are desired or seem indicated. Crisis service -- Aware of call list and work-in appts.  Call the clinic on-call service, 988/hotline, 911, or present to [facility] or ER if any life-threatening psychiatric crisis. Followup -- Return for time as already scheduled.  Next scheduled visit with me 01/04/2024.  Next scheduled in this office 01/04/2024.  Robley Fries, PhD Tonya Czar, PhD LP Clinical Psychologist, [name] [address] [place], [place] [address] [phone]

## 2024-01-04 ENCOUNTER — Ambulatory Visit (INDEPENDENT_AMBULATORY_CARE_PROVIDER_SITE_OTHER): Payer: Commercial Managed Care - PPO | Admitting: Psychiatry

## 2024-01-04 DIAGNOSIS — F411 Generalized anxiety disorder: Secondary | ICD-10-CM

## 2024-01-04 DIAGNOSIS — Z630 Problems in relationship with spouse or partner: Secondary | ICD-10-CM

## 2024-01-04 DIAGNOSIS — Z636 Dependent relative needing care at home: Secondary | ICD-10-CM

## 2024-01-04 DIAGNOSIS — F4323 Adjustment disorder with mixed anxiety and depressed mood: Secondary | ICD-10-CM

## 2024-01-04 DIAGNOSIS — Z62820 Parent-biological child conflict: Secondary | ICD-10-CM

## 2024-01-04 NOTE — Progress Notes (Signed)
Psychotherapy Progress Note [name] [name], PhD LP  Patient ID: Tonya Yang [facility])    MRN: [id number] Therapy format: Individual psychotherapy Date: 01/04/2024      Start: 1:10p     Stop: 2:00p     Time Spent: 50 min Location: In-person   Session narrative (presenting needs, interim history, self-report of stressors and symptoms, applications of prior therapy, status changes, and interventions made in session) Impressed with deep [place] snowstorm this week.  Still dismayed at husband for not shoveling the back patio, where they frequently go out to feed birds, and more so with his age old tendency to deny and project back allegations of being persecutorial or "nasty" to him.  Discussed further special tactics for communication.  Review seems to confirm impressions of [name] as immature, likely in a spectrum way, with mother issues.  Explored possibilities for changing her approach to recruit agreements to more basic things, like just hearing it out, allowing feedback without having to decide its merits or defend anything, and if lucky, some active listening.  Encouraged to seek any number of smaller improvements that going full on to apologies the way she wants.  Overall, says he is making some efforts recently.  Supportively challenged to notice and reward incremental improvement, within reason.  Therapeutic modalities: Cognitive Behavioral Therapy and Solution-Oriented/Positive Psychology  Mental Status/Observations:  Appearance:   Casual     Behavior:  Appropriate  Motor:  Normal  Speech/Language:   Clear and Coherent  Affect:  Appropriate  Mood:  Generally positive, frustrated with subject  Thought process:  normal  Thought content:    WNL  Sensory/Perceptual disturbances:    WNL  Orientation:  Fully oriented  Attention:  Good    Concentration:  Good  Memory:  WNL  Insight:    Good  Judgment:   Good  Impulse Control:  Good   Risk  Assessment: Danger to Self: No Self-injurious Behavior: No Danger to Others: No Physical Aggression / Violence: No Duty to Warn: No Access to Firearms a concern: No  Assessment of progress:  progressing  Diagnosis:   ICD-10-CM   1. Generalized anxiety disorder  F41.1     2. Relationship problem between partners  Z63.0     3. Caregiver stress  Z63.6     4. Relationship problem with parent x2  Z62.820     5. Adjustment disorder with mixed anxiety and depressed mood  F43.23      Plan:  Marital distress For best results, emphasize getting an agreement to deal with difficult subjects before presenting a case, and be willing to ask him what he hears before trying to argue harm done When possible, choose letting hurt show over anger -- better chances of H's conscience getting through Monitor for any risk of flooding [name] -- understood to be somewhat on the spectrum -- and of unrealistic expectations for him to be suddenly empathetic, thoughtful, or gracious as she would try to be and accept as thoroughly as possible that even if she wants what she is good at under stress, he's different As able, recognize and reward improvements in behavior, same as in childrearing or animal training, and make sure not to expect too evolved learning, lest it set up more resentment Separation is an option, but best acted on with notice rather than on impulse If useful, open to including Tonya Yang in therapy Family conflict in general Note tendency to dwell on what's wrong/unfair about others' behavior and work  toward setting her own limits and asking what people want more than calling it out in blame Try to limit telling people off, focus on refusing unfair requirements and asserting her wishes without framing them as demands or "should haves" Look for friendlier behavior to reward somehow, with explicit thanks or just more of the kind of time her counterpart wants ("stealth behavior therapy") Watch tendency  to proselytize, when empathizing and example will reach better When tempted to outrage with family, try to clarify wishes before responding OK to pursue reconciliation with father or let it lie.  If trying, be mindful of the drawbacks of any tendency to preach or proselytize, and to be mistaken for judgmental, and try to center her message more on her own care and concern for him, and wishes with him.  Try to bypass any grudging feelings toward [name] and emphasize seeking F's courage to connect over any inclination to blame her as manipulating him. Anxiety/anger management Use relaxing breathing, any method available, to reduce overall tension/anxiety/arousal Practice acknowledgment when frustrated -- recognize when body is trying to say it for her and acknowledge openly or privately instead Try to allow for physical reactions to stress, trust them to return to normal after working the (dreaded) problem Self-affirm when reasonable actions have been taken and it's time to let others respond Trust dreams as emotional brain working out unfinished emotions, not sign of deterioration or overwhelm unless frankly waking her up in panic Health issues and pain TMJ/Anti-bruxism -- Continue habit reversal technique for jaw tension (tongue-thrusting, blowing gently and slowly in place of clenching).  Option focused muscle relaxation -- relax thoroughly when tempted to clench, imagine tongue very heavy. Lumbar stenosis -- Concur with topical and oral antiinflammatory strategies, injection offered.  Concur with likely adverse effects of opioid medication, but may do genetic testing to guide the choice if needed. Genetic testing -- recommended to help guide psychiatric medication choices, understand if she cannot stomach the perceived privacy risks Inflammatory response -- Consider further assessing inflammatory response and treatment to tone down negative emotional intensity and pain issues Mother's care  dilemma Monitor for more serious signs of mania, delirium, dementia, or other psychosis and activate connections with local help.  Option to ask for welfare check by local resources if concerning enough.  Otherwise, confer as actively as can work with her doctors and offer context. To prepare for eventual strain deciding some form of assisted living, research the forms, costs, and process for beginning in-home care and independent or assisted living ata home, primarily so they can all reach the subject armed with facts.  Self-affirm that it is OK to decline taking M in herself, provided a safe and adequate alternative can be presented, then M can decide to accept or buck it and Jesusita Oka stand ready to call in local services for welfare checks if she thinks there may be a need. Brainstorm ways to tell M no to unreasonable requests and offer a reasonable alternative.  Option to break tension by paradoxically imagining the worst ways to tell her, not just the "good" or "nice" ones.  Continue broaching the subject at discretion of looking into rational alternatives to living with Annabelle Harman, including the spectrum of care from in-home assistance to facility-based care to peer community living. Other recommendations/advice -- As may be noted above.  Continue to utilize previously learned skills ad lib. Medication compliance -- Maintain medication as prescribed and work faithfully with relevant prescriber(s) if any changes are desired or seem  indicated. Crisis service -- Aware of call list and work-in appts.  Call the clinic on-call service, 988/hotline, 911, or present to [facility] or ER if any life-threatening psychiatric crisis. Followup -- Return for time as already scheduled.  Next scheduled visit with me 01/11/2024.  Next scheduled in this office 01/11/2024.  Robley Fries, PhD [name], PhD LP Clinical Psychologist, [name] [address] [place], [place] [address] [phone]

## 2024-01-09 ENCOUNTER — Ambulatory Visit: Payer: Commercial Managed Care - PPO | Admitting: Obstetrics and Gynecology

## 2024-01-11 ENCOUNTER — Ambulatory Visit: Payer: Commercial Managed Care - PPO | Admitting: Psychiatry

## 2024-01-11 DIAGNOSIS — Z630 Problems in relationship with spouse or partner: Secondary | ICD-10-CM | POA: Diagnosis not present

## 2024-01-11 DIAGNOSIS — Z636 Dependent relative needing care at home: Secondary | ICD-10-CM

## 2024-01-11 DIAGNOSIS — F411 Generalized anxiety disorder: Secondary | ICD-10-CM

## 2024-01-11 DIAGNOSIS — F4323 Adjustment disorder with mixed anxiety and depressed mood: Secondary | ICD-10-CM
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
## 2024-01-15 ENCOUNTER — Ambulatory Visit: Payer: Commercial Managed Care - PPO | Admitting: Psychiatry

## 2024-01-15 DIAGNOSIS — F411 Generalized anxiety disorder: Secondary | ICD-10-CM

## 2024-01-15 DIAGNOSIS — G8929 Other chronic pain: Secondary | ICD-10-CM | POA: Diagnosis not present

## 2024-01-15 DIAGNOSIS — F4323 Adjustment disorder with mixed anxiety and depressed mood: Secondary | ICD-10-CM | POA: Diagnosis not present

## 2024-01-15 DIAGNOSIS — Z62820 Parent-biological child conflict: Secondary | ICD-10-CM

## 2024-01-15 DIAGNOSIS — Z636 Dependent relative needing care at home: Secondary | ICD-10-CM | POA: Diagnosis not present

## 2024-01-15 NOTE — Progress Notes (Signed)
Psychotherapy Progress Note [name] [name], PhD LP  Patient ID: Tonya Yang)    MRN: [id number] Therapy format: Individual psychotherapy Date: 01/15/2024      Start: 11:16a     Stop: 12:16p     Time Spent: 45 min (remainder donated) Location: Telehealth visit -- I connected with this patient by an approved telecommunication method (audio only), with her informed consent, and verifying identity and patient privacy.  I was located at my office and patient at her car.  As needed, we discussed the limitations, risks, and security and privacy concerns associated with telehealth service, including the availability and conditions which currently govern in-person appointments and the possibility that 3rd-party payment may not be fully guaranteed and she may be responsible for charges.  After she indicated understanding, we proceeded with the session.  Also discussed treatment planning, as needed, including ongoing verbal agreement with the plan, the opportunity to ask and answer all questions, her demonstrated understanding of instructions, and her readiness to call the office should symptoms worsen or she feels she is in a crisis state and needs more immediate and tangible assistance.   Session narrative (presenting needs, interim history, self-report of stressors and symptoms, applications of prior therapy, status changes, and interventions made in session) Pt accidentally double booked her time, has been at another health care appt, but able to pull out and take phone call rather than have a no show.  Main concern is Tonya Yang's mental health, as she still sees bugs, and she has developed worse shaking since going to 50mg  Seroquel.  Difficult, because she is highly prone to anxiety about meds and psychosomatic reactions, as well as heightened anxiety about meds.  Hx of conclusion-jumping about meds, and hx of some stabilization on benzodiazepines.  Fall hx not actually  associated with BZ, and she has a history of reducing does, not escalating.  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  Unfortunately, she has had a 2nd exterminator come out, but he also responsibly reinforced reality that what she saw as a bug was really a crumb.  She has also been procuring bug spray, raising fears of spending too much money and possibly poisoning herself in addition to all the extra effort.  Meanwhile, she continues to call about high blood pressure on weekends, though she still has a detailed physician's plan, she just goes pathetic and somatic until Tonya Yang will make the call for her.  She does in fact have some times when she naps long, or takes a medication that will send her pressure too low.    Discussed making arrangements for a friend, neighbor, or someone else to be Tonya Yang's local eyes and hands to check on her in a pinch and willing to coordinate.  Brother, whom Tonya Yang would listen to, is occupied, and apparently trying to steer clear.  Prioritized Tonya Yang's friend Tonya Yang, whose judgment she trusts, and who has seen a lot of Tonya Yang's behavior before, and offered to be the local contact at Christmas visit.  Recommended take her up on it, work out terms for looking in on Tonya Yang every so often (she has not socialized with anyone since October) and being willing to be on some degree of call to evaluate firsthand and guide any emergency services that may be needed.    Therapeutic modalities: Cognitive Behavioral Therapy, Solution-Oriented/Positive Psychology, Ego-Supportive, and Psycho-education/Bibliotherapy  Mental Status/Observations:  Appearance:   Not assessed     Behavior:  Appropriate  Motor:  Not assessed  Speech/Language:   Clear and Coherent  Affect:  Not assessed  Mood:  anxious  Thought process:  normal  Thought content:    WNL  Sensory/Perceptual disturbances:    WNL  Orientation:  Fully oriented  Attention:  Good    Concentration:  Good  Memory:  WNL  Insight:    Good   Judgment:   Good  Impulse Control:  Good   Risk Assessment: Danger to Self: No Self-injurious Behavior: No Danger to Others: No Physical Aggression / Violence: No Duty to Warn: No Access to Firearms a concern: No  Assessment of progress:  stabilized  Diagnosis:   ICD-10-CM   1. Generalized anxiety disorder  F41.1     2. Adjustment disorder with mixed anxiety and depressed mood  F43.23     3. Caregiver stress  Z63.6     4. Relationship problem with parent x2  Z62.820     5. 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    G89.29      Plan:  Marital distress For best results, emphasize getting an agreement to deal with difficult subjects before presenting a case, and be willing to ask him what he hears before trying to argue harm done When possible, choose letting hurt show over anger -- better chances of H's conscience getting through Monitor for any risk of flooding Tonya Yang -- understood to be somewhat on the spectrum -- and of unrealistic expectations for him to be suddenly empathetic, thoughtful, or gracious as she would try to be and accept as thoroughly as possible that even if she wants what she is good at under stress, he's different For tendencies to offload responsibilities, balk at being bothered, and try to get away with paying half attention, recommend the positive parenting approach, "After you ___, then I can ___" As able, recognize and reward improvements in behavior, same as in childrearing or animal training, and make sure not to expect too evolved learning, lest it set up more resentment Separation is an option, but best acted on with notice rather than on impulse If useful, open to including Tonya Yang in therapy Family conflict in general Note tendency to dwell on what's wrong/unfair about others' behavior and work toward setting her own limits and asking what people want more than calling it out in blame Try to limit telling people off, focus on  refusing unfair requirements and asserting her wishes without framing them as demands or "should haves" Look for friendlier behavior to reward somehow, with explicit thanks or just more of the kind of time her counterpart wants ("stealth behavior therapy") Watch tendency to proselytize, when empathizing and example will reach better When tempted to outrage with family, try to clarify wishes before responding OK to pursue reconciliation with father or let it lie.  If trying, be mindful of the drawbacks of any tendency to preach or proselytize, and to be mistaken for judgmental, and try to center her message more on her own care and concern for him, and wishes with him.  Try to bypass any grudging feelings toward Tonya Yang and emphasize seeking Tonya Yang's courage to connect over any inclination to blame her as manipulating him. Anxiety/anger management Use relaxing breathing, any method available, to reduce overall tension/anxiety/arousal Practice acknowledgment when frustrated -- recognize when body is trying to say it for her and acknowledge openly or privately instead Try to allow for physical reactions to stress, trust them to return to normal after working the (  dreaded) problem Self-affirm when reasonable actions have been taken and it's time to let others respond Trust dreams as emotional brain working out unfinished emotions, not sign of deterioration or overwhelm unless frankly waking her up in panic Health issues and pain TMJ/Anti-bruxism -- Continue habit reversal technique for jaw tension (tongue-thrusting, blowing gently and slowly in place of clenching).  Option focused muscle relaxation -- relax thoroughly when tempted to clench, imagine tongue very heavy. Lumbar stenosis -- Concur with topical and oral antiinflammatory strategies, injection offered.  Concur with likely adverse effects of opioid medication, but may do genetic testing to guide the choice if needed. Genetic testing -- recommended to  help guide psychiatric medication choices, understand if she cannot stomach the perceived privacy risks Inflammatory response -- Consider further assessing inflammatory response and treatment to tone down negative emotional intensity and pain issues Mother's remote care dilemmas Monitor for more serious signs of mania, delirium, dementia, or other psychosis and activate connections with local help, including brother if willing, good friend Tonya Yang (priority), and in more acute need police or  EMT for a welfare check.  Otherwise, establish open informant relationship with her doctors to report responses to treatment and complications. When forced to field distress calls from Tonya Yang, try to maintain emotional equilibrium and work through steps getting Tonya Yang to think and take constructive action rather than be helpless and desperate -- have you looked at doctor's instructions, do you need someone like [place] Yang to come over, would you let the ambulance come, etc.  If recalcitrant, confront with how, if it's that serious, it will nee someone called in, so Tonya Yang will do that next, unless she's able to pull it together to work the steps of self-care and nonemergency help. For nerve-jangling episodes unable to reach Tonya Yang, establish a local option to check on her, be willing to use it. To prepare for eventual strain deciding some form of assisted living, research the forms, costs, and process for beginning in-home care and independent or assisted living ata home, primarily so they can all reach the subject armed with facts.  Self-affirm that it is OK to decline taking Tonya Yang in herself, provided a safe and adequate alternative can be presented, then Tonya Yang can decide to accept or buck it and Tonya Yang stand ready to call in local services for welfare checks if she thinks there may be a need. Brainstorm ways to tell Tonya Yang no to unreasonable requests and offer a reasonable alternative.  Option to break tension by paradoxically imagining the worst ways to tell  her, not just the "good" or "nice" ones.  Continue broaching the subject at discretion of looking into rational alternatives to living with Tonya Yang, including the spectrum of care from in-home assistance to facility-based care to peer community living. Other recommendations/advice -- As may be noted above.  Continue to utilize previously learned skills ad lib. Medication compliance -- Maintain medication as prescribed and work faithfully with relevant prescriber(s) if any changes are desired or seem indicated. Crisis service -- Aware of call list and work-in appts.  Call the clinic on-call service, 988/hotline, 911, or present to [facility] or ER if any life-threatening psychiatric crisis. Followup -- Return for time as already scheduled.  Next scheduled visit with me 02/07/2024.  Next scheduled in this office 02/07/2024.  Robley Fries, PhD [name], PhD LP Clinical Psychologist, [name] [address] [place], [place] [address] [phone]

## 2024-01-22 ENCOUNTER — Other Ambulatory Visit: Payer: Commercial Managed Care - PPO

## 2024-01-31 ENCOUNTER — Ambulatory Visit (INDEPENDENT_AMBULATORY_CARE_PROVIDER_SITE_OTHER): Payer: Commercial Managed Care - PPO | Admitting: Psychiatry

## 2024-01-31 DIAGNOSIS — Z636 Dependent relative needing care at home: Secondary | ICD-10-CM

## 2024-01-31 DIAGNOSIS — F4323 Adjustment disorder with mixed anxiety and depressed mood: Secondary | ICD-10-CM | POA: Diagnosis not present

## 2024-01-31 DIAGNOSIS — Z62820 Parent-biological child conflict: Secondary | ICD-10-CM

## 2024-01-31 DIAGNOSIS — Z630 Problems in relationship with spouse or partner: Secondary | ICD-10-CM

## 2024-01-31 DIAGNOSIS — F411 Generalized anxiety disorder: Secondary | ICD-10-CM

## 2024-01-31 NOTE — Progress Notes (Signed)
 Psychotherapy Progress Note [name] [name], PhD LP  Patient ID: Tonya Yang [facility])    MRN: [id number] Therapy format: Individual psychotherapy Date: 01/31/2024      Start: 1:09p     Stop: 1:56p     Time Spent: 47 min Location: In-person   Session narrative (presenting needs, interim history, self-report of stressors and symptoms, applications of prior therapy, status changes, and interventions made in session) Cont stress of dealing with [name] dependency for guidance about her blood pressure and conditional medication, now daily calls to make decisions for her.  Wound up chewing her out the other morning, maybe has her thinking better about using her up with phone calls and then being unavailable when [facility] follows up.  Been working one habit of screenshotting M the instructions she ignores at her own home and pointing out the relevant part.  Has reached back out to [name] psychiatrist, also, about sxs, including a tremor that got better then worse again after a higher dose of Seroquel.  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  Unfortunately, her shaking also returned worse after the first few days.  Has called again this morning, and now trusts that she is welcome informing the psychiatrist.  Memory Argue possibility that mother experiences such a payoff for having sxs she can generate them unconsciously and somaticize.  Endorsed efficient ways of getting her to pay attention to the instructions she already has, and encourage staying as matter of fact as possible when assisting her.  Meanwhile, [name] remains difficult.  Admittedly she is in a longterm habit of laying out his clothes, badge, and medicine in the morning, and seriously tiring of it.  Supportively challenged that she has played along with  enough of his badgering and rationalized enough of his ineptitude that he seems to have come to depend on such efforts, whereas it would not be the end of the world if he had to deal more with his own preparations for work, face consequences of running late or forgetting, and learn from it, even this far along in his working life.  Therapeutic modalities: Cognitive Behavioral Therapy, Solution-Oriented/Positive Psychology, and Ego-Supportive  Mental Status/Observations:  Appearance:   Casual     Behavior:  Appropriate  Motor:  Normal  Speech/Language:   Clear and Coherent  Affect:  Appropriate  Mood:  dysthymic  Thought process:  normal  Thought content:    WNL  Sensory/Perceptual disturbances:    WNL  Orientation:  Fully oriented  Attention:  Good    Concentration:  Good  Memory:  WNL  Insight:    Good  Judgment:   Good  Impulse Control:  Good   Risk Assessment: Danger to Self: No Self-injurious Behavior: No Danger to Others: No Physical Aggression / Violence: No Duty to Warn: No Access to Firearms a concern: No  Assessment of progress:  progressing  Diagnosis:   ICD-10-CM   1. Generalized anxiety disorder  F41.1     2. Caregiver stress  Z63.6     3. Adjustment disorder with mixed anxiety and depressed mood  F43.23     4. Relationship problem with parent x2  Z62.820     5. Relationship problem between partners  Z63.0      Plan:  Marital distress For best results, emphasize getting an agreement  to deal with difficult subjects before presenting a case, and be willing to ask him what he hears before trying to argue harm done When possible, choose letting hurt show over anger -- better chances of H's conscience getting through Monitor for any risk of flooding [name] -- understood to be somewhat on the spectrum -- and of unrealistic expectations for him to be suddenly empathetic, thoughtful, or gracious as she would try to be and accept as thoroughly as possible that even  if she wants what she is good at under stress, he's different For tendencies to offload responsibilities, balk at being bothered, and try to get away with paying half attention, recommend the positive parenting approach, "After you ___, then I can ___" As able, recognize and reward improvements in behavior, same as in childrearing or animal training, and make sure not to expect too evolved learning, lest it set up more resentment Separation is an option, but best acted on with notice rather than on impulse If useful, open to including [name] in therapy Family conflict in general Note tendency to dwell on what's wrong/unfair about others' behavior and work toward setting her own limits and asking what people want more than calling it out in blame Try to limit telling people off, focus on refusing unfair requirements and asserting her wishes without framing them as demands or "should haves" Look for friendlier behavior to reward somehow, with explicit thanks or just more of the kind of time her counterpart wants ("stealth behavior therapy") Watch tendency to proselytize, when empathizing and example will reach better When tempted to outrage with family, try to clarify wishes before responding OK to pursue reconciliation with father or let it lie.  If trying, be mindful of the drawbacks of any tendency to preach or proselytize, and to be mistaken for judgmental, and try to center her message more on her own care and concern for him, and wishes with him.  Try to bypass any grudging feelings toward [name] and emphasize seeking F's courage to connect over any inclination to blame her as manipulating him. Anxiety/anger management Use relaxing breathing, any method available, to reduce overall tension/anxiety/arousal Practice acknowledgment when frustrated -- recognize when body is trying to say it for her and acknowledge openly or privately instead Try to allow for physical reactions to stress, trust them to  return to normal after working the (dreaded) problem Self-affirm when reasonable actions have been taken and it's time to let others respond Trust dreams as emotional brain working out unfinished emotions, not sign of deterioration or overwhelm unless frankly waking her up in panic Health issues and pain TMJ/Anti-bruxism -- Continue habit reversal technique for jaw tension (tongue-thrusting, blowing gently and slowly in place of clenching).  Option focused muscle relaxation -- relax thoroughly when tempted to clench, imagine tongue very heavy. Lumbar stenosis -- Concur with topical and oral antiinflammatory strategies, injection offered.  Concur with likely adverse effects of opioid medication, but may do genetic testing to guide the choice if needed. Genetic testing -- recommended to help guide psychiatric medication choices, understand if she cannot stomach the perceived privacy risks Inflammatory response -- Consider further assessing inflammatory response and treatment to tone down negative emotional intensity and pain issues Mother's remote care dilemmas Monitor for more serious signs of mania, delirium, dementia, or other psychosis and activate connections with local help, including brother if willing, good friend Hinton Lions (priority), and in more acute need police or  EMT for a welfare check.  Otherwise, establish  open informant relationship with her doctors to report responses to treatment and complications. When forced to field distress calls from M, try to maintain emotional equilibrium and work through steps getting M to think and take constructive action rather than be helpless and desperate -- have you looked at doctor's instructions, do you need someone like Lyle Lions to come over, would you let the ambulance come, etc.  If recalcitrant, confront with how, if it's that serious, it will nee someone called in, so Kareem will do that next, unless she's able to pull it together to work the steps of self-care  and nonemergency help. For nerve-jangling episodes unable to reach M, establish a local option to check on her, be willing to use it. To prepare for eventual strain deciding some form of assisted living, research the forms, costs, and process for beginning in-home care and independent or assisted living ata home, primarily so they can all reach the subject armed with facts.  Self-affirm that it is OK to decline taking M in herself, provided a safe and adequate alternative can be presented, then M can decide to accept or buck it and Jesusita Oka stand ready to call in local services for welfare checks if she thinks there may be a need. Brainstorm ways to tell M no to unreasonable requests and offer a reasonable alternative.  Option to break tension by paradoxically imagining the worst ways to tell her, not just the "good" or "nice" ones.  Continue broaching the subject at discretion of looking into rational alternatives to living with Annabelle Harman, including the spectrum of care from in-home assistance to facility-based care to peer community living. Other recommendations/advice -- As may be noted above.  Continue to utilize previously learned skills ad lib. Medication compliance -- Maintain medication as prescribed and work faithfully with relevant prescriber(s) if any changes are desired or seem indicated. Crisis service -- Aware of call list and work-in appts.  Call the clinic on-call service, 988/hotline, 911, or present to [facility] or ER if any life-threatening psychiatric crisis. Followup -- Return for time as already scheduled.  Next scheduled visit with me 02/07/2024.  Next scheduled in this office 02/07/2024.  Robley Fries, PhD [name], PhD LP Clinical Psychologist, [name] [address], [address] [place], [place] [address] [phone]

## 2024-02-07 ENCOUNTER — Ambulatory Visit: Payer: Commercial Managed Care - PPO | Admitting: Psychiatry

## 2024-02-12 ENCOUNTER — Ambulatory Visit: Payer: Commercial Managed Care - PPO | Admitting: Psychiatry

## 2024-02-12 DIAGNOSIS — F4323 Adjustment disorder with mixed anxiety and depressed mood: Secondary | ICD-10-CM | POA: Diagnosis not present

## 2024-02-12 DIAGNOSIS — F411 Generalized anxiety disorder: Secondary | ICD-10-CM | POA: Diagnosis not present

## 2024-02-12 DIAGNOSIS — Z62820 Parent-biological child conflict: Secondary | ICD-10-CM

## 2024-02-12 DIAGNOSIS — Z636 Dependent relative needing care at home: Secondary | ICD-10-CM | POA: Diagnosis not present

## 2024-02-12 DIAGNOSIS — Z630 Problems in relationship with spouse or partner: Secondary | ICD-10-CM

## 2024-02-12 NOTE — Progress Notes (Signed)
 Psychotherapy Progress Note [name] [name], PhD LP  Patient ID: Ramiah Helfrich [facility])    MRN: [id number] Therapy format: Individual psychotherapy Date: 02/12/2024      Start: 3:08p     Stop: 4:15p     Time Spent: 45 min (remainder donated) Location: In-person   Session narrative (presenting needs, interim history, self-report of stressors and symptoms, applications of prior therapy, status changes, and interventions made in session) Main focus on mother's situation.  More stressful still, as she seems unable to take any antipsychotics.  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  Unfortunately, the antipsychotics have each seemed to exaggerate her essential tremor, hypothesis here being that dopaminergic suppression that's good for the motivational and perceiving brain is unfortunately handicapping her motor brain.    Meanwhile, Pt recalls a call saying someone had broken in and stolen cards out of a phone wallet she keeps in the house and she needs help -- something more like typical dementia.  As suspected, it turned out M had taken her cards out, put them in a book safe trying to be cautious, and not remembered it, also raising the likelihood of dementia.  Notable moment when M said she must have it, and she knows she's headed for a nursing home, and Marai kept silent, sensing it was manipulation, fishing for a rescue.  Affirmed her keeping silent -- assuming it was manipulation -- as a shrewd piece of behavior therapy (time out).  Otherwise, just a welcome development for M to speak acceptingly of what she has panicked about and sworn not to happen before.    Associated story retold of Kathie Rhodes Junious Dresser having a psychotic break of her own sometime back, then trying  in vain to be live-in care for M, winding up being no help, sleeping in and going through mood cycles of her own, then blaming M for not being grateful and with the program.  Allowed to vent, feeling galled again about that.    Back to M, recently coaxed her to call her own doctor, but she decided to wait, then panic-called Annabelle Harman and pressed for her to call for her.  Seems to reinforce the idea that M will choose dependency under duress, but maybe it's not so organized and scheming as once feared.    Addressed distress, discussed responses and strategies going forward for simultaneously addressing altered mental status, personality-disordered manipulation, signs of dementia, and signs of delirium all at the same time.  Meanwhile, still irritated with [name] for how his habits of communication and the legacy of her doing things to make up for his executive function continue to burden her on the one hand and set her up to be crabbed at on the other.  Allowed to vent.  Therapeutic modalities: Cognitive Behavioral Therapy, Solution-Oriented/Positive Psychology, Ego-Supportive, and Assertiveness/Communication  Mental Status/Observations:  Appearance:   Casual     Behavior:  Appropriate  Motor:  Normal  Speech/Language:   Clear and Coherent  Affect:  Appropriate  Mood:  anxious and irritable  Thought process:  normal  Thought content:    WNL  Sensory/Perceptual disturbances:    WNL  Orientation:  Fully oriented  Attention:  Good    Concentration:  Good  Memory:  WNL  Insight:    Good  Judgment:  Good  Impulse Control:  Good   Risk Assessment: Danger to Self: No Self-injurious Behavior: No Danger to Others: No Physical Aggression / Violence: No Duty to Warn: No Access to Firearms a concern: No  Assessment of progress:  stabilized  Diagnosis:   ICD-10-CM   1. Generalized anxiety disorder  F41.1     2. Adjustment disorder with mixed anxiety and depressed mood  F43.23     3. Caregiver  stress  Z63.6     4. Relationship problem with parent x2  Z62.820     5. Relationship problem between partners  Z63.0      Plan:  Mother's remote care burden Continue to monitor for more serious signs of mania, delirium, dementia, or other psychosis and activate connections with local help.  Inform and include siblings according to her judgment, and [name] good friend Botetourt Lions, as well as [name] PCP and psychiatrist as appropriate.  In more acute need, police or EMT for a welfare check.  Otherwise, nurture open-channel informant relationships with her doctors. When forced to field distress calls from M, try to maintain emotional equilibrium and work through the steps getting M to either think, take constructive action, or allow local help.  Useful prompts to ask if she has looked at doctor's instructions, do you need Russell Lions to come over, would you let the ambulance come, etc., to help her keep calmer and more rational.  If recalcitrant, confront her with how, if it's that alarming, then it's clear she does need someone to come over, , so Maryela will call them forthwith, unless M is able to pull it together to work the steps of self-care and nonemergency help. For nerve-jangling episodes being unable to reach M, establish a local option like [place] Lions or a Child psychotherapist, and be willing to use it. To get ahead of the seemingly inevitable strain taking on some form of assisted living, go ahead and research the spectrum of care and costs.  Self-affirm that it is OK to decline taking M in personally, provided a safe and adequate alternative can be found.  Then M can rationally decide to accept or buck it, or she can show herself too irrational to make a competent choice, in which case call in local services for welfare checks and/or evaluation of competency to manage. Brainstorm ways to tell M no to unreasonable requests and offer a reasonable alternative.  For tension felt confronting her, option to break tension by  paradoxically imagining the worst ways to tell her, not just the "good" or "nice" ones.  Continue broaching the subject at discretion of looking into rational alternatives to living with Annabelle Harman, including the spectrum of care from in-home assistance to facility-based care to peer community living. Marital distress For best results, emphasize getting an agreement to deal with difficult subjects before presenting a case, and be willing to ask him what he hears before trying to argue harm done When possible, choose letting hurt show over anger -- better chances of H's conscience getting through Monitor for any risk of flooding [name] -- understood to be somewhat on the spectrum -- and of unrealistic expectations for him to be suddenly empathetic, thoughtful, or gracious as she would try to be and accept as thoroughly as possible that even if she wants what she is good at under stress, he's different For tendencies to offload responsibilities, balk at being bothered, and try to get away with paying half attention, recommend the positive parenting approach, "After you ___, then I  can ___" As able, recognize and reward improvements in behavior, same as in childrearing or animal training, and make sure not to expect too evolved learning, lest it set up more resentment Separation is an option, but best acted on with notice rather than on impulse If useful, open to including [name] in therapy Family conflict in general Note tendency to dwell on what's wrong/unfair about others' behavior and work toward setting her own limits and asking what people want more than calling it out in blame Try to limit telling people off, focus on refusing unfair requirements and asserting her wishes without framing them as demands or "should haves" Look for friendlier behavior to reward somehow, with explicit thanks or just more of the kind of time her counterpart wants ("stealth behavior therapy") Watch tendency to proselytize, when  empathizing and example will reach better When tempted to outrage with family, try to clarify wishes before responding OK to pursue reconciliation with father or let it lie.  If trying, be mindful of the drawbacks of any tendency to preach or proselytize, and to be mistaken for judgmental, and try to center her message more on her own care and concern for him, and wishes with him.  Try to bypass any grudging feelings toward [name] and emphasize seeking F's courage to connect over any inclination to blame her as manipulating him. Anxiety/anger management Use relaxing breathing, any method available, to reduce overall tension/anxiety/arousal Practice acknowledgment when frustrated -- recognize when body is trying to say it for her and acknowledge openly or privately instead Try to allow for physical reactions to stress, trust them to return to normal after working the (dreaded) problem Self-affirm when reasonable actions have been taken and it's time to let others respond Trust dreams as emotional brain working out unfinished emotions, not sign of deterioration or overwhelm unless frankly waking her up in panic Health issues and pain TMJ/Anti-bruxism -- Continue habit reversal technique for jaw tension (tongue-thrusting, blowing gently and slowly in place of clenching).  Option focused muscle relaxation -- relax thoroughly when tempted to clench, imagine tongue very heavy. Lumbar stenosis -- Concur with topical and oral antiinflammatory strategies, injection offered.  Concur with likely adverse effects of opioid medication, but may do genetic testing to guide the choice if needed. Genetic testing -- recommended to help guide psychiatric medication choices, understand if she cannot stomach the perceived privacy risks Inflammatory response -- Consider further assessing inflammatory response and treatment to tone down negative emotional intensity and pain issues Other recommendations/advice -- As may be  noted above.  Continue to utilize previously learned skills ad lib. Medication compliance -- Maintain medication as prescribed and work faithfully with relevant prescriber(s) if any changes are desired or seem indicated. Crisis service -- Aware of call list and work-in appts.  Call the clinic on-call service, 988/hotline, 911, or present to [facility] or ER if any life-threatening psychiatric crisis. Followup -- Return for time as already scheduled.  Next scheduled visit with me 02/19/2024.  Next scheduled in this office 02/19/2024.  Robley Fries, PhD [name], PhD LP Clinical Psychologist, [name] [address] [place], [place] [address] [phone]

## 2024-02-12 NOTE — Progress Notes (Incomplete)
 Psychotherapy Progress Note [name] [name], PhD LP  Patient ID: Tonya Yang [facility])    MRN: [id number] Therapy format: Individual psychotherapy Date: 01/31/2024      Start: 1:09p     Stop: 1:56p     Time Spent: 47 min Location: In-person   Session narrative (presenting needs, interim history, self-report of stressors and symptoms, applications of prior therapy, status changes, and interventions made in session) Cont stress of dealing with [name] dependency for guidance about her blood pressure and conditional medication, now daily calls to make decisions for her.  Maybe has her thinking better about using her up with phone calls and then being unavailable when she follows up, since chewing her out the other morning.  Affirmed habit of screenshotting M her instructions and pointing out   Has reached back out to [name] psychiatrist, also, re tremor that got a good bit worse with higher dose of Seroquel.  Now trying olanzapine, and sleeping better, and even went out to lunch with a friend 1st time in 4 months.  However, her shaking also got worse after the first few days.   Has called again this morning, and now trusts that she is welcome informing the psychiatrist.  [name] remains difficult.  Admittedly she is in the habit of laying out his clothes, badge, medicine in the morning, and tiring of it.    Therapeutic modalities: {AM:23362::"Cognitive Behavioral Therapy","Solution-Oriented/Positive Psychology"}  Mental Status/Observations:  Appearance:   {PSY:22683}     Behavior:  {PSY:21022743}  Motor:  {PSY:22302}  Speech/Language:   {PSY:22685}  Affect:  {PSY:22687}  Mood:  {PSY:31886}  Thought process:  {PSY:31888}  Thought content:    {PSY:[phone]}  Sensory/Perceptual disturbances:    {[phone]}  Orientation:  {Psych Orientation:23301::"Fully oriented"}  Attention:  {Good-Fair-Poor ratings:23770::"Good"}    Concentration:  {Good-Fair-Poor  ratings:23770::"Good"}  Memory:  {PSY:[phone]}  Insight:    {Good-Fair-Poor ratings:23770::"Good"}  Judgment:   {Good-Fair-Poor ratings:23770::"Good"}  Impulse Control:  {Good-Fair-Poor ratings:23770::"Good"}   Risk Assessment: Danger to Self: {Risk:22599::"No"} Self-injurious Behavior: {Risk:22599::"No"} Danger to Others: {Risk:22599::"No"} Physical Aggression / Violence: {Risk:22599::"No"} Duty to Warn: {AMYesNo:22526::"No"} Access to Firearms a concern: {AMYesNo:22526::"No"}  Assessment of progress:  {Progress:22147::"progressing"}  Diagnosis: No diagnosis found. Plan:  *** Other recommendations/advice -- As may be noted above.  Continue to utilize previously learned skills ad lib. Medication compliance -- Maintain medication as prescribed and work faithfully with relevant prescriber(s) if any changes are desired or seem indicated. Crisis service -- Aware of call list and work-in appts.  Call the clinic on-call service, 988/hotline, 911, or present to [facility] or ER if any life-threatening psychiatric crisis. Followup -- No follow-ups on file.  Next scheduled visit with me 02/07/2024.  Next scheduled in this office 02/07/2024.  Robley Fries, PhD [name], PhD LP Clinical Psychologist, [name] [address] [place], [place] [address] [phone]

## 2024-02-19 ENCOUNTER — Ambulatory Visit: Payer: Commercial Managed Care - PPO | Admitting: Psychiatry

## 2024-02-28 ENCOUNTER — Ambulatory Visit (INDEPENDENT_AMBULATORY_CARE_PROVIDER_SITE_OTHER): Payer: Commercial Managed Care - PPO | Admitting: Psychiatry

## 2024-02-28 DIAGNOSIS — F411 Generalized anxiety disorder: Secondary | ICD-10-CM

## 2024-02-28 DIAGNOSIS — Z636 Dependent relative needing care at home: Secondary | ICD-10-CM | POA: Diagnosis not present

## 2024-02-28 DIAGNOSIS — Z62820 Parent-biological child conflict: Secondary | ICD-10-CM

## 2024-02-28 DIAGNOSIS — Z630 Problems in relationship with spouse or partner: Secondary | ICD-10-CM

## 2024-02-28 DIAGNOSIS — F4323 Adjustment disorder with mixed anxiety and depressed mood: Secondary | ICD-10-CM

## 2024-02-28 NOTE — Progress Notes (Signed)
 Psychotherapy Progress Note [name] [name], PhD LP  Patient ID: Lasha Echeverria [facility])    MRN: [id number] Therapy format: Individual psychotherapy Date: 02/28/2024      Start: 3:10p     Stop: 4:20p     Time Spent: 45 min (remainder donated) Location: In-person   Session narrative (presenting needs, interim history, self-report of stressors and symptoms, applications of prior therapy, status changes, and interventions made in session) Session was cancelled for thinking she would need to go visit mom, then plans changed.  M is in hospital, and brother -- who couldn't be bothered in January -- has stepped up.    Meanwhile, [name] drove to [place] to surprise help his 90yo father with cancer, driving through when a violent spring storm crossed the country.  Glad he went, but irritated for [name] not to call and check in, say good night, like he used to, or even report himself arrived [place].  Seems very thoughtless, burned her up to see herself apparently not be thought of, but then she relates a history of difficulties with him not thinking beyond his own tasks, then time after time rationalizing why she should feel secure or not need such demonstrations.  Regrets ever getting married, but has felt stuck a long time.  [name] has been watching his tone lately, which is something, but agreed it does feel hugely regretful, lonely, and disappointing.  Meanwhile, [name] seems to have relaxed, suspected on medication now.  Back story of a controlling wife of his who alienated the family.    Back to M, got a small breakthrough while she was still at home, trying to counter her delusion of bugs, getting her to understand that "sometimes the brain misfires" and reads things into perceptions, which she greeted with amazed interest.  Hospital trip developed from a further odyssey with medication, where she got put on Abilify, again had adverse motor sxs, but this time her legs just  wouldn't work.  She also started cycling through panic attacks, eventually called EMTs and went to the ER, apparently for trending into psychosis.  That has stepped up a good deal in the hospital, including seeing a man trying to listen in on her phone call, and interacting with people who aren't there.  Discussed what to make of it, reinforced coordination with treatment team and psychiatrist.  Tomorrow will go down to [place] to help figure out her situation.  Indications enough that M has surpassed her ability to live independently, so briefed on what to expect of discharge planning, nursing home search, and whether she may pass through a rehab stay that can double as an acclimation experience and help dispel any catastrophized ideas of home life.  Therapeutic modalities: Cognitive Behavioral Therapy, Solution-Oriented/Positive Psychology, and Ego-Supportive  Mental Status/Observations:  Appearance:   Casual     Behavior:  Appropriate  Motor:  Normal  Speech/Language:   Clear and Coherent  Affect:  Appropriate  Mood:  anxious  Thought process:  normal  Thought content:    WNL  Sensory/Perceptual disturbances:    WNL  Orientation:  Fully oriented  Attention:  Good    Concentration:  Good  Memory:  WNL  Insight:    Good  Judgment:   Good  Impulse Control:  Good   Risk Assessment: Danger to Self: No Self-injurious Behavior: No Danger to Others: No Physical Aggression / Violence: No Duty to Warn: No Access to Firearms a concern: No  Assessment of progress:  progressing  Diagnosis:   ICD-10-CM   1. Generalized anxiety disorder  F41.1     2. Adjustment disorder with mixed anxiety and depressed mood  F43.23     3. Caregiver stress  Z63.6     4. Relationship problem with parent x2  Z62.820     5. Relationship problem between partners  Z63.0      Plan:  Mother's remote care burden Continue to monitor for more serious signs of mania, delirium, dementia, or other psychosis and  activate connections with local help.  Inform and include siblings according to her judgment, and [name] good friend [place] [facility], as well as [name] PCP and psychiatrist as appropriate.  In more acute need, police or EMT for a welfare check.  Otherwise, nurture open-channel informant relationships with her doctors. When forced to field distress calls from M, try to maintain emotional equilibrium and work through the steps getting M to either think, take constructive action, or allow local help.  Useful prompts to ask if she has looked at doctor's instructions, do you need [place] [facility] to come over, would you let the ambulance come, etc., to help her keep calmer and more rational.  If recalcitrant, confront her with how, if it's that alarming, then it's clear she does need someone to come over, , so Rashema will call them forthwith, unless M is able to pull it together to work the steps of self-care and nonemergency help. For nerve-jangling episodes being unable to reach M, establish a local option like [place] [facility] or a Child psychotherapist, and be willing to use it. To get ahead of the seemingly inevitable strain taking on some form of assisted living, go ahead and research the spectrum of care and costs.  Self-affirm that it is OK to decline taking M in personally, provided a safe and adequate alternative can be found.  Then M can rationally decide to accept or buck it, or she can show herself too irrational to make a competent choice, in which case call in local services for welfare checks and/or evaluation of competency to manage. Brainstorm ways to tell M no to unreasonable requests and offer a reasonable alternative.  For tension felt confronting her, option to break tension by paradoxically imagining the worst ways to tell her, not just the "good" or "nice" ones.  Continue broaching the subject at discretion of looking into rational alternatives to living with Annabelle Harman, including the spectrum of care from in-home assistance to facility-based care  to peer community living. Marital distress For best results, emphasize getting an agreement to deal with difficult subjects before presenting a case, and be willing to ask him what he hears before trying to argue harm done When possible, choose letting hurt show over anger -- better chances of H's conscience getting through Monitor for any risk of flooding [name] -- understood to be somewhat on the spectrum -- and of unrealistic expectations for him to be suddenly empathetic, thoughtful, or gracious as she would try to be and accept as thoroughly as possible that even if she wants what she is good at under stress, he's different For tendencies to offload responsibilities, balk at being bothered, and try to get away with paying half attention, recommend the positive parenting approach, "After you ___, then I can ___" As able, recognize and reward improvements in behavior, same as in childrearing or animal training, and make sure not to expect too evolved learning, lest it set up more resentment Separation is an option, but best acted on with  notice rather than on impulse If useful, open to including [name] in therapy Family conflict in general Note tendency to dwell on what's wrong/unfair about others' behavior and work toward setting her own limits and asking what people want more than calling it out in blame Try to limit telling people off, focus on refusing unfair requirements and asserting her wishes without framing them as demands or "should haves" Look for friendlier behavior to reward somehow, with explicit thanks or just more of the kind of time her counterpart wants ("stealth behavior therapy") Watch tendency to proselytize, when empathizing and example will reach better When tempted to outrage with family, try to clarify wishes before responding OK to pursue reconciliation with father or let it lie.  If trying, be mindful of the drawbacks of any tendency to preach or proselytize, and to be  mistaken for judgmental, and try to center her message more on her own care and concern for him, and wishes with him.  Try to bypass any grudging feelings toward [name] and emphasize seeking F's courage to connect over any inclination to blame her as manipulating him. Anxiety/anger management Use relaxing breathing, any method available, to reduce overall tension/anxiety/arousal Practice acknowledgment when frustrated -- recognize when body is trying to say it for her and acknowledge openly or privately instead Try to allow for physical reactions to stress, trust them to return to normal after working the (dreaded) problem Self-affirm when reasonable actions have been taken and it's time to let others respond Trust dreams as emotional brain working out unfinished emotions, not sign of deterioration or overwhelm unless frankly waking her up in panic Health issues and pain TMJ/Anti-bruxism -- Continue habit reversal technique for jaw tension (tongue-thrusting, blowing gently and slowly in place of clenching).  Option focused muscle relaxation -- relax thoroughly when tempted to clench, imagine tongue very heavy. Lumbar stenosis -- Concur with topical and oral antiinflammatory strategies, injection offered.  Concur with likely adverse effects of opioid medication, but may do genetic testing to guide the choice if needed. Genetic testing -- recommended to help guide psychiatric medication choices, understand if she cannot stomach the perceived privacy risks Inflammatory response -- Consider further assessing inflammatory response and treatment to tone down negative emotional intensity and pain issues Other recommendations/advice -- As may be noted above.  Continue to utilize previously learned skills ad lib. Medication compliance -- Maintain medication as prescribed and work faithfully with relevant prescriber(s) if any changes are desired or seem indicated. Crisis service -- Aware of call list and work-in  appts.  Call the clinic on-call service, 988/hotline, 911, or present to [facility] or ER if any life-threatening psychiatric crisis. Followup -- Return for time as already scheduled.  Next scheduled visit with me 03/08/2024.  Next scheduled in this office 03/08/2024.  Robley Fries, PhD [name], PhD LP Clinical Psychologist, [name] [address] [place], [place] [address] [phone]

## 2024-03-08 ENCOUNTER — Ambulatory Visit (INDEPENDENT_AMBULATORY_CARE_PROVIDER_SITE_OTHER): Payer: Commercial Managed Care - PPO | Admitting: Psychiatry

## 2024-03-08 DIAGNOSIS — F411 Generalized anxiety disorder: Secondary | ICD-10-CM

## 2024-03-08 DIAGNOSIS — Z636 Dependent relative needing care at home: Secondary | ICD-10-CM

## 2024-03-08 DIAGNOSIS — Z630 Problems in relationship with spouse or partner: Secondary | ICD-10-CM | POA: Diagnosis not present

## 2024-03-08 DIAGNOSIS — Z638 Other specified problems related to primary support group: Secondary | ICD-10-CM

## 2024-03-08 DIAGNOSIS — F4323 Adjustment disorder with mixed anxiety and depressed mood: Secondary | ICD-10-CM | POA: Diagnosis not present

## 2024-03-08 DIAGNOSIS — Z62820 Parent-biological child conflict: Secondary | ICD-10-CM

## 2024-03-08 NOTE — Progress Notes (Signed)
 Psychotherapy Progress Note [name] Tonya Czar, PhD LP  Patient ID: Tonya Yang)    MRN: [id number] Therapy format: Individual psychotherapy Date: 03/08/2024      Start: 2:09p     Stop: 3:11p     Time Spent: 62 Location: Telehealth visit -- I connected with this patient by an approved telecommunication method (audio only), with her informed consent, and verifying identity and patient privacy.  I was located at my office and patient at her car.  As needed, we discussed the limitations, risks, and security and privacy concerns associated with telehealth service, including the availability and conditions which currently govern in-person appointments and the possibility that 3rd-party payment may not be fully guaranteed and she may be responsible for charges.  After she indicated understanding, we proceeded with the session.  Also discussed treatment planning, as needed, including ongoing verbal agreement with the plan, the opportunity to ask and answer all questions, her demonstrated understanding of instructions, and her readiness to call the office should symptoms worsen or she feels she is in a crisis state and needs more immediate and tangible assistance.   Session narrative (presenting needs, interim history, self-report of stressors and symptoms, applications of prior therapy, status changes, and interventions made in session) Extended session to deal with numerous questions and issues handling her mother's hospitalization and elder care placement.  Reached while traveling.  Decided to have Tonya Hua go with her so she could work the phones coordinating care.  All told, Tonya Petit has been in hospital about 2 weeks, with twists and turns whether to classify her psychiatric, not qualifying as inpatient psych, not qualifying for rehab unit, clearly displaying leg weakness, now more florid hallucinations, and going on and off Abilify repeatedly in the hospital.  Required Haldol  at one point, now on low dose Seroquel, as had been suggested earlier by [facility].  Hard to get what she knows taken seriously by nurses, has informed staff about current meds but then they appear again anyway, and Yang was given statin that had been d/c'd 6 months ago for muscle pain, and she was almost put on prednisone (+ Tonya Yang/o inducing manic psychosis) -- which had barely been caught when the ortho prescribed it.  E-chart med list obviously not updated well, as often happens.  Does appreciate the handling of Tonya Yang case by inpatient neurology and psychiatry, and Yang did get a neuro eval finally, after a week there.  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  Hx of essential tremor first noticed 15 years ago, Parkinson's denied, now it looks like Parkinson's might be part of it, after all.  MRI soon after admission did not show identifiable Lewy bodies, but did show hx of small CVA.    Educated at some length about varieties of dementia, crossover among them, and how the collection of  symptoms Yang has can easily reflect multiple etiologies, including vascular and Lewy dementias, delirium from vitamin deficiency, medication side effects, somatization, and functional neuro disorder.  Supported in the frustration of having known better earlier about Lewy and meds but not been able to make a difference with Tonya Yang care as an outpatient.  Working relationship with outpatient psychiatry and PCP remains good.    Beyond that, silver lining now in how compliant Yang is, how willing to accept decisions being made for her, even  conjecturing about nursing home herself and seemingly at peace with it.  Another positive in that Yang is doing bedbound exercises, even without PT involved.  Outlook for placement is complex.  She lives in [place] but gets best care across he line in [place], [place].   Already told she could not be placed in a nursing home there -- best guess that she is Medicaid-imminent and it would be very difficult to work out cross-border.  Will look at facilities in [place] nearer [place], [place], and Sigourney's brother.  Meanwhile, unclear as yet whether Yang will qualify for a rehab placement (which could be in [place], covered by Medicare A).  Best can be determined, medical team is indicating she go to a "skilled rehab", which probably means Part A qualified.  Educated on the provisions of Medicare vs. Medicaid, the likely criteria for going on it, and corrected misinformation about her income qualifications as a nursing home resident vs outpatient.  In all likelihood, Yang would complete her spenddown after just 1 month in skilled care, so advised to go ahead and begin the application, which could run 70 pp and require 5 years of Tonya Yang financial records.  Very grateful for the education and alerts.  Directed to hospital social worker, Tonya Yang local DSS Medicaid desk, and placement coordinator for more.  Affirmed and encouraged.  Beyond these, reveals she and Tonya Yang have been wanting to relocate [place], and may se fit to move to [facility] for multiple reasons.  Therapeutic modalities: Cognitive Behavioral Therapy, Solution-Oriented/Positive Psychology, and Ego-Supportive  Mental Status/Observations:  Appearance:   Not assessed     Behavior:  Appropriate  Motor:  Not assessed  Speech/Language:   Clear and Coherent  Affect:  Not assessed  Mood:  anxious  Thought process:  normal  Thought content:    WNL  Sensory/Perceptual disturbances:    WNL  Orientation:  Fully oriented  Attention:  Good    Concentration:  Good  Memory:  WNL  Insight:    Good  Judgment:   Good  Impulse Control:  Good   Risk Assessment: Danger to Self: No Self-injurious Behavior: No Danger to Others: No Physical Aggression / Violence: No Duty to Warn: No Access to Firearms a concern: No  Assessment of  progress:  progressing  Diagnosis:   ICD-10-CM   1. Generalized anxiety disorder  F41.1     2. Caregiver stress  Z63.6     3. Adjustment disorder with mixed anxiety and depressed mood  F43.23     4. Relationship problem with parent x2  Z62.820     5. Relationship problem between partners  Z63.0     6. Relationship problem with family member  Z63.8      Plan:  Mother's remote care burden In all likelihood, Yang must now take on facility living, in which case Pt needs to pursue placement, Medicaid application and records, and clarify when and where she will be a Part A rehab patient.  Recommend conferring with siblings openly, as tolerated, to make care and placement decisions. Continue to monitor for more serious signs of mania, delirium, dementia, or other psychosis and activate connections with local help.  Inform and include siblings according to her judgment, and Tonya Yang good friend [place] Lions, as well as Tonya Yang PCP and psychiatrist as appropriate.  In more acute need, police or EMT for a welfare check.  Otherwise, nurture open-channel informant relationships with her doctors. When forced to field distress calls from Yang, try to maintain emotional  equilibrium and work through the steps getting Yang to either think, take constructive action, or allow local help.  Useful prompts to ask if she has looked at doctor's instructions, do you need [place] [facility] to come over, would you let the ambulance come, etc., to help her keep calmer and more rational.  If recalcitrant, confront her with how, if it's that alarming, then it's clear she does need someone to come over, , so Cythina will call them forthwith, unless Yang is able to pull it together to work the steps of self-care and nonemergency help. For nerve-jangling episodes being unable to reach Yang, establish a local option like [place] [facility] or a Child psychotherapist, and be willing to use it. To get ahead of the seemingly inevitable strain taking on some form of assisted living, go ahead and  research the spectrum of care and costs.  Self-affirm that it is OK to decline taking Yang in personally, provided a safe and adequate alternative can be found.  Then Yang can rationally decide to accept or buck it, or she can show herself too irrational to make a competent choice, in which case call in local services for welfare checks and/or evaluation of competency to manage. Brainstorm ways to tell Yang no to unreasonable requests and offer a reasonable alternative.  For tension felt confronting her, option to break tension by paradoxically imagining the worst ways to tell her, not just the "good" or "nice" ones.  Continue broaching the subject at discretion of looking into rational alternatives to living with Annabelle Harman, including the spectrum of care from in-home assistance to facility-based care to peer community living. Marital distress For best results, emphasize getting an agreement to deal with difficult subjects before presenting a case, and be willing to ask him what he hears before trying to argue harm done When possible, choose letting hurt show over anger -- better chances of Tonya Yang's conscience getting through Monitor for any risk of flooding Tonya Hua -- understood to be somewhat on the spectrum -- and of unrealistic expectations for him to be suddenly empathetic, thoughtful, or gracious as she would try to be and accept as thoroughly as possible that even if she wants what she is good at under stress, he's different For tendencies to offload responsibilities, balk at being bothered, and try to get away with paying half attention, recommend the positive parenting approach, "After you ___, then I can ___" As able, recognize and reward improvements in behavior, same as in childrearing or animal training, and make sure not to expect too evolved learning, lest it set up more resentment Separation is an option, but best acted on with notice rather than on impulse If useful, open to including [name] in therapy Family  conflict in general Note tendency to dwell on what's wrong/unfair about others' behavior and work toward setting her own limits and asking what people want more than calling it out in blame Try to limit telling people off, focus on refusing unfair requirements and asserting her wishes without framing them as demands or "should haves" Look for friendlier behavior to reward somehow, with explicit thanks or just more of the kind of time her counterpart wants ("stealth behavior therapy") Watch tendency to proselytize, when empathizing and example will reach better When tempted to outrage with family, try to clarify wishes before responding OK to pursue reconciliation with father or let it lie.  If trying, be mindful of the drawbacks of any tendency to preach or proselytize, and to be mistaken for judgmental,  and try to center her message more on her own care and concern for him, and wishes with him.  Try to bypass any grudging feelings toward [name] and emphasize seeking F's courage to connect over any inclination to blame her as manipulating him. Anxiety/anger management Use relaxing breathing, any method available, to reduce overall tension/anxiety/arousal Practice acknowledgment when frustrated -- recognize when body is trying to say it for her and acknowledge openly or privately instead Try to allow for physical reactions to stress, trust them to return to normal after working the (dreaded) problem Self-affirm when reasonable actions have been taken and it's time to let others respond Trust dreams as emotional brain working out unfinished emotions, not sign of deterioration or overwhelm unless frankly waking her up in panic Health issues and pain TMJ/Anti-bruxism -- Continue habit reversal technique for jaw tension (tongue-thrusting, blowing gently and slowly in place of clenching).  Option focused muscle relaxation -- relax thoroughly when tempted to clench, imagine tongue very heavy. Lumbar stenosis  -- Concur with topical and oral antiinflammatory strategies, injection offered.  Concur with likely adverse effects of opioid medication, but may do genetic testing to guide the choice if needed. Genetic testing -- recommended to help guide psychiatric medication choices, understand if she cannot stomach the perceived privacy risks Inflammatory response -- Consider further assessing inflammatory response and treatment to tone down negative emotional intensity and pain issues Other recommendations/advice -- As may be noted above.  Continue to utilize previously learned skills ad lib. Medication compliance -- Maintain medication as prescribed and work faithfully with relevant prescriber(s) if any changes are desired or seem indicated. Crisis service -- Aware of call list and work-in appts.  Call the clinic on-call service, 988/hotline, 911, or present to [facility] or ER if any life-threatening psychiatric crisis. Followup -- Return for time as already scheduled.  Next scheduled visit with me 03/12/2024.  Next scheduled in this office 03/12/2024.  Robley Fries, PhD Tonya Czar, PhD LP Clinical Psychologist, [name] [address], [address] [place], [place] [address] [phone]

## 2024-03-12 ENCOUNTER — Ambulatory Visit: Admitting: Psychiatry

## 2024-03-12 DIAGNOSIS — Z0289 Encounter for other administrative examinations: Secondary | ICD-10-CM

## 2024-03-22 ENCOUNTER — Ambulatory Visit: Payer: Commercial Managed Care - PPO | Admitting: Psychiatry

## 2024-03-22 DIAGNOSIS — Z636 Dependent relative needing care at home: Secondary | ICD-10-CM | POA: Diagnosis not present

## 2024-03-22 DIAGNOSIS — F4323 Adjustment disorder with mixed anxiety and depressed mood: Secondary | ICD-10-CM

## 2024-03-22 DIAGNOSIS — F411 Generalized anxiety disorder: Secondary | ICD-10-CM

## 2024-03-22 DIAGNOSIS — Z630 Problems in relationship with spouse or partner: Secondary | ICD-10-CM

## 2024-03-22 DIAGNOSIS — Z62820 Parent-biological child conflict: Secondary | ICD-10-CM

## 2024-03-22 DIAGNOSIS — Z638 Other specified problems related to primary support group: Secondary | ICD-10-CM

## 2024-03-22 NOTE — Progress Notes (Signed)
 Psychotherapy Progress Note [name] Jodie Kendall, PhD LP  Patient ID: Tonya [facility])    MRN: [id number] Therapy format: Individual psychotherapy Date: 03/22/2024      Start: 2:10p     Stop: 3:10p     Time Spent: 45 min (remainder donated) Location: In-person   Session narrative (presenting needs, interim history, self-report of stressors and symptoms, applications of prior therapy, status changes, and interventions made in session) Back from trip to help mother through hospitalization.  Clear at this point that she is not capable of living on her own any more.  Pointed memories of mom saddling her with the burden of being the favored/dependable child for emotional support and insisting she never put her in a nursing home, going back 30 years, then of sister raging at mom, telling her sibs Belem and Charlie would stick her in a nursing home but she wouldn't.  Mixed feelings of pressure and betrayal, allowed to vent.  At this point M has gone to a rehab, delayed by her being flagged as a level 2 elder patient.  Had to field insurance question she suspected re discharge plan, and likely to have to decide a permanent placement next week.  Noticing M losing her window of lucidity during the day, and noticing the sudden absence of her calling daily.  Gratified to see [name] step up some more, and getting his help looking into a suitable nursing home.  Some further diagnostic argument that she has Lewy Body disease, since she had a paradoxical reaction to Haldol.  Does feel that the several misattributions doctors made putting her on or off meds listed on her chart threw off her functioning and clouded the picture.   Recalls how M was fairly normal, then dropped status fairly suddenly in the fall.  Also how she got put on various medicines for tremor.  Pattern of soft evidence confirming Lewy Body diagnosis, including her better response to Seroquel.  [name] has  reportedly done some foot dragging, but SIL reminding him how time is ticking.  Some issues with whether to try to activate Medicaid or draw down home value first.  Explained usual standards for Medicaid and conditions on property.  32nd wedding anniversary yesterday, which is not particularly a happy occasion.  VEAR Lenis keeps having emotional overreactions and personalizing moments in what she understands to be simultaneously hypersensitive and spectrum behavior.  Narrates interactions at some length which do sound frustrating and fractious.  Support/validation provided, refreshed insights and tools for managing her own resentment and working with what she has.  Therapeutic modalities: Cognitive Behavioral Therapy, Solution-Oriented/Positive Psychology, Ego-Supportive, and Psycho-education/Bibliotherapy  Mental Status/Observations:  Appearance:   Casual     Behavior:  Appropriate  Motor:  Normal  Speech/Language:   Clear and Coherent  Affect:  Appropriate  Mood:  anxious and dysthymic  Thought process:  normal  Thought content:    WNL  Sensory/Perceptual disturbances:    WNL  Orientation:  Fully oriented  Attention:  Good    Concentration:  Good  Memory:  WNL  Insight:    Good  Judgment:   Good  Impulse Control:  Good   Risk Assessment: Danger to Self: No Self-injurious Behavior: No Danger to Others: No Physical Aggression / Violence: No Duty to Warn: No Access to Firearms a concern: No  Assessment of progress:  stabilized  Diagnosis:   ICD-10-CM   1. Generalized anxiety disorder  F41.1     2. Caregiver  stress  Z63.6     3. Adjustment disorder with mixed anxiety and depressed mood  F43.23     4. Relationship problem with parent x2  Z62.820     5. Relationship problem between partners  Z63.0     6. Relationship problem with family member  Z63.8      Plan:  Mother's remote care burden Follow through researching and deciding facility placement with siblings. Concur on  Lewy Body suspicions, endorse sharing as needed with health care providers If any issues remain in M balking or making unreasonable requests, option to break tension by paradoxically imagining the worst ways to tell her, not just the good or nice ones.   Marital distress Monitor for any risk of flooding Alm -- understood to be somewhat on the spectrum -- and of unrealistic expectations for him to be suddenly empathetic, thoughtful, or gracious as she would try to be and accept as thoroughly as possible that even if she wants what she is good at under stress, he's different For tendencies to offload responsibilities, balk at being bothered, and try to get away with paying half attention, recommend the positive parenting approach, After you ___, then I can ___ For best results, emphasize process of getting an agreement to deal with difficult subjects before presenting one, and be willing to ask him what he hears before trying to argue harm done When possible, choose letting hurt show over anger -- better chances of H's conscience getting through As able, recognize and reward improvements in behavior, same as in childrearing or animal training, and make sure not to expect too evolved learning, lest it set up more resentment Separation is an option, but best acted on with notice rather than on impulse If useful, open to including VEAR Alm in therapy Family conflict in general Note tendency to dwell on what's wrong/unfair about others' behavior and work toward setting her own limits and asking what people want more than calling it out in blame Try to limit telling people off, focus on refusing unfair requirements and asserting her wishes without framing them as demands or should haves Look for friendlier behavior to reward somehow, with explicit thanks or just more of the kind of time her counterpart wants (stealth behavior therapy) Watch tendency to proselytize, when empathizing and example will  reach better When tempted to outrage with family, try to clarify wishes before responding OK to pursue reconciliation with father or let it lie.  If trying, be mindful of the drawbacks of any tendency to preach or proselytize, and to be mistaken for judgmental, and try to center her message more on her own care and concern for him, and wishes with him.  Try to bypass any grudging feelings toward [name] and emphasize seeking F's courage to connect over any inclination to blame her as manipulating him. Anxiety/anger management Use relaxing breathing, any method available, to reduce overall tension/anxiety/arousal Practice acknowledgment when frustrated -- recognize when body is trying to say it for her and acknowledge openly or privately instead Try to allow for physical reactions to stress, trust them to return to normal after working the (dreaded) problem Self-affirm when reasonable actions have been taken and it's time to let others respond Trust dreams as emotional brain working out unfinished emotions, not sign of deterioration or overwhelm unless frankly waking her up in panic Health issues and pain TMJ/Anti-bruxism -- Continue habit reversal technique for jaw tension (tongue-thrusting, blowing gently and slowly in place of clenching).  Option focused  muscle relaxation -- relax thoroughly when tempted to clench, imagine tongue very heavy. Lumbar stenosis -- Concur with topical and oral antiinflammatory strategies, injection offered.  Concur with likely adverse effects of opioid medication, but may do genetic testing to guide the choice if needed. Genetic testing -- recommended to help guide psychiatric medication choices, understand if she cannot stomach the perceived privacy risks Inflammatory response -- Consider further assessing inflammatory response and treatment to tone down negative emotional intensity and pain issues Other recommendations/advice -- As may be noted above.  Continue to utilize  previously learned skills ad lib. Medication compliance -- Maintain medication as prescribed and work faithfully with relevant prescriber(s) if any changes are desired or seem indicated. Crisis service -- Aware of call list and work-in appts.  Call the clinic on-call service, 988/hotline, 911, or present to [facility] or ER if any life-threatening psychiatric crisis. Followup -- Return for time as already scheduled.  Next scheduled visit with me 03/28/2024.  Next scheduled in this office 03/28/2024.  Lamar Kendall, PhD Jodie Kendall, PhD LP Clinical Psychologist, [name] [address] [place], [place] [address] [phone]

## 2024-03-28 ENCOUNTER — Ambulatory Visit (INDEPENDENT_AMBULATORY_CARE_PROVIDER_SITE_OTHER): Payer: Commercial Managed Care - PPO | Admitting: Psychiatry

## 2024-03-28 DIAGNOSIS — Z638 Other specified problems related to primary support group: Secondary | ICD-10-CM

## 2024-03-28 DIAGNOSIS — G8929 Other chronic pain: Secondary | ICD-10-CM

## 2024-03-28 DIAGNOSIS — Z636 Dependent relative needing care at home: Secondary | ICD-10-CM

## 2024-03-28 DIAGNOSIS — Z630 Problems in relationship with spouse or partner: Secondary | ICD-10-CM

## 2024-03-28 DIAGNOSIS — F411 Generalized anxiety disorder: Secondary | ICD-10-CM

## 2024-03-28 DIAGNOSIS — F4323 Adjustment disorder with mixed anxiety and depressed mood: Secondary | ICD-10-CM

## 2024-03-28 NOTE — Progress Notes (Signed)
 Psychotherapy Progress Note [name] Jodie Kendall, PhD LP  Patient ID: Evanthia Maund [facility])    MRN: [id number] Therapy format: Individual psychotherapy Date: 03/28/2024      Start: 2:06p     Stop: 2:56p     Time Spent: 50 min Location: In-person   Session narrative (presenting needs, interim history, self-report of stressors and symptoms, applications of prior therapy, status changes, and interventions made in session) More discontent with her marriage and [name]'s perpetual defensiveness.  Decision made to cash out [name] car, and apply to Medicaid, with expectation her spend down will complete soon.  Just off a cluttered conversation with Alm, with him jumping to conclusions and defending issues she did not raise.  Highly frustrated with him right now, allowed to vent.  A lot of clashes seem to come from him personalizing things and sometimes maybe making himself responsible for things he doesn't have to.  Clear that he has habits of quibbling, sidetracking, blame-the-victim, and then hounding her to keep from losing face if she chooses to exit or time out conflict.  Frustrated how it all happens despite her efforts to mange her tone, keep from yelling herself, keep the high road.  Support/validation provided, reaffirmed how whatever she does to not return fire, it is because it's being the adults she means to be and because it prevents getting more snarled, not because she has to be deferential or traditional submissive to be acceptable.  The rest remains choice whether to spend energy or not at any particular time, and to find some degree of efficiency focusing on one problem at hand, because it seems to be futile, often enough, to confront multiple issues.  Encouraged in the quiet authority of just stopping, letting him know when she'll return if able, and physically withdrawing if needed to end getting badgered and projected on.  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  Warming up now to work part-time again, which may help manage tensions.  Therapeutic modalities: Cognitive Behavioral Therapy, Solution-Oriented/Positive Psychology, Ego-Supportive, and Assertiveness/Communication  Mental Status/Observations:  Appearance:   Casual     Behavior:  Appropriate  Motor:  Normal  Speech/Language:   Clear and Coherent  Affect:  Appropriate  Mood:  frustrated  Thought process:  normal  Thought content:    WNL and resentment  Sensory/Perceptual disturbances:    WNL  Orientation:  Fully oriented  Attention:  Good    Concentration:  Fair  Memory:  WNL  Insight:    Good  Judgment:   Good  Impulse Control:  Good   Risk Assessment: Danger to Self: No Self-injurious Behavior: No Danger to Others: No Physical Aggression / Violence: No Duty to Warn: No Access to Firearms a concern: No  Assessment of progress:  stabilized  Diagnosis:   ICD-10-CM   1. Generalized anxiety disorder  F41.1     2. Relationship problem between partners  Z63.0     3. Caregiver stress  Z63.6     4. Adjustment disorder with mixed anxiety and depressed mood  F43.23     5. Relationship problem with family member  Z63.8     6. 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 Concur on Lewy Body suspicions, endorse sharing as needed with health care providers If any issues remain in M  balking or making unreasonable requests, option to break tension by paradoxically imagining the worst ways to tell her, not just the good or nice ones.   Marital distress Monitor for any risk of flooding Alm -- understood to be somewhat on the spectrum -- and of unrealistic expectations for him to be suddenly empathetic, thoughtful, or gracious as she would try to be and accept as thoroughly as possible that even if she wants what she is  good at under stress, he's different For tendencies to offload responsibilities, balk at being bothered, and try to get away with paying half attention, recommend the positive parenting approach, After you ___, then I can ___ For best results, emphasize process of getting an agreement to deal with difficult subjects before presenting one, and be willing to ask him what he hears before trying to argue harm done When possible, choose letting hurt show over anger -- better chances of H's conscience getting through As able, recognize and reward improvements in behavior, same as in childrearing or animal training, and make sure not to expect too evolved learning, lest it set up more resentment Separation is an option, but best acted on with notice rather than on impulse If useful, open to including VEAR Alm in therapy Family conflict in general Note tendency to dwell on what's wrong/unfair about others' behavior and work toward setting her own limits and asking what people want more than calling it out in blame Try to limit telling people off, focus on refusing unfair requirements and asserting her wishes without framing them as demands or should haves Look for friendlier behavior to reward somehow, with explicit thanks or just more of the kind of time her counterpart wants (stealth behavior therapy) Watch tendency to proselytize, when empathizing and example will reach better When tempted to outrage with family, try to clarify wishes before responding OK to pursue reconciliation with father or let it lie.  If trying, be mindful of the drawbacks of any tendency to preach or proselytize, and to be mistaken for judgmental, and try to center her message more on her own care and concern for him, and wishes with him.  Try to bypass any grudging feelings toward [name] and emphasize seeking F's courage to connect over any inclination to blame her as manipulating him. Anxiety/anger management Use relaxing  breathing, any method available, to reduce overall tension/anxiety/arousal Practice acknowledgment when frustrated -- recognize when body is trying to say it for her and acknowledge openly or privately instead Try to allow for physical reactions to stress, trust them to return to normal after working the (dreaded) problem Self-affirm when reasonable actions have been taken and it's time to let others respond Trust dreams as emotional brain working out unfinished emotions, not sign of deterioration or overwhelm unless frankly waking her up in panic Health issues and pain TMJ/Anti-bruxism -- Continue habit reversal technique for jaw tension (tongue-thrusting, blowing gently and slowly in place of clenching).  Option focused muscle relaxation -- relax thoroughly when tempted to clench, imagine tongue very heavy. Lumbar stenosis -- Concur with topical and oral antiinflammatory strategies, injection offered.  Concur with likely adverse effects of opioid medication, but may do genetic testing to guide the choice if needed. Genetic testing -- Recommended to help guide psychiatric medication choices, but understand if she cannot stomach the perceived privacy risks Inflammatory response -- Consider further assessing inflammatory response and treatment to tone down negative emotional intensity and pain issues Other recommendations/advice -- As may be noted above.  Continue to utilize previously learned skills ad lib. Medication compliance -- Maintain medication as prescribed and work faithfully with relevant prescriber(s) if any changes are desired or seem indicated. Crisis service -- Aware of call list and work-in appts.  Call the clinic on-call service, 988/hotline, 911, or present to [facility] or ER if any life-threatening psychiatric crisis. Followup -- Return for time as already scheduled.  Next scheduled visit with me 04/11/2024.  Next scheduled in this office 04/11/2024.  Addendum, 6/30: Noted on review  cancellation of 6 appts 4/28 through 6/2, presumably for family care needs out of state.  Marital dissatisfaction may also play a role.  Free to schedule at discretion.  Lamar Kendall, PhD Jodie Kendall, PhD LP Clinical Psychologist, [name] [address] [place], [place] [address] [phone]

## 2024-04-08 ENCOUNTER — Ambulatory Visit: Admitting: Psychiatry

## 2024-04-11 ENCOUNTER — Ambulatory Visit: Admitting: Psychiatry

## 2024-04-15 ENCOUNTER — Ambulatory Visit: Admitting: Psychiatry

## 2024-04-24 ENCOUNTER — Ambulatory Visit: Admitting: Psychiatry

## 2024-05-09 ENCOUNTER — Ambulatory Visit: Admitting: Psychiatry

## 2024-05-13 ENCOUNTER — Ambulatory Visit: Admitting: Psychiatry

## 2024-05-15 ENCOUNTER — Encounter: Payer: Self-pay | Admitting: Obstetrics and Gynecology

## 2024-05-15 ENCOUNTER — Ambulatory Visit (INDEPENDENT_AMBULATORY_CARE_PROVIDER_SITE_OTHER): Payer: Commercial Managed Care - PPO | Admitting: Obstetrics and Gynecology

## 2024-05-15 VITALS — BP 126/84 | HR 87 | Ht 61.5 in | Wt 110.0 lb

## 2024-05-15 DIAGNOSIS — Z01419 Encounter for gynecological examination (general) (routine) without abnormal findings: Secondary | ICD-10-CM

## 2024-05-15 DIAGNOSIS — Z1331 Encounter for screening for depression: Secondary | ICD-10-CM

## 2024-05-15 NOTE — Patient Instructions (Signed)

## 2024-05-15 NOTE — Progress Notes (Signed)
 63 y.o. G60P0000 Married Caucasian female here for annual exam.    No GYN concerns.   Patient will have an abbreviated MRI due to her dense breast tissue and hx breast cancer.   Not sexually active.  Too painful.   Mother is now in long term care in [place] .  She receives Hospice support.  Stressful for patient.   PCP: Authur Leghorn, MD - [facility]  Patient's last menstrual period was 12/12/2014 (approximate).           Sexually active: No.  The current method of family planning is abstinence.    Menopausal hormone therapy:  n/a Exercising: Yes.    Walking/running, aerobics, weight lifting  Smoker:  no  OB History  Gravida Para Term Preterm AB Living  0 0 0 0 0 0  SAB IAB Ectopic Multiple Live Births  0 0 0 0 0     HEALTH MAINTENANCE: Last 2 paps:  08/30/22 neg HR HPV neg, 01/21/19 ASCUS, neg HR HPV.   History of abnormal Pap or positive HPV:  yes Mammogram:   12/21/23 [facility] Cat 1 benign (Care everywhere), cat D density Colonoscopy:  04/14/17, 2023 - Dr. Tova Fresh Bone Density:  09/08/23  Result  osteopenia - [facility]  Immunization History  Administered Date(s) Administered   Influenza,inj,Quad PF,6+ Mos 09/23/2019   Janssen (J&J) SARS-COV-2 Vaccination 04/09/2020   Moderna Sars-Covid-2 Vaccination 01/21/2021   Tdap 11/19/2018      reports that she has never smoked. She has never used smokeless tobacco. She reports that she does not drink alcohol and does not use drugs.  Past Medical History:  Diagnosis Date   Abnormal Pap smear of cervix 1990   Posible cryotherapy.   Anxiety    Cancer (HCC) 03/02/2017   left breast cancer   Fibroids    HSV-1 infection    HTN (hypertension)    Melanoma (HCC)    left tricep   Osteopenia    TMJ (dislocation of temporomandibular joint)     Past Surgical History:  Procedure Laterality Date   BREAST SURGERY  03/02/2017   left lumpectomy--[facility]   MELANOMA EXCISION     left tricep   TRIGGER FINGER RELEASE Right 04/25/2018   right  ring finger    Current Outpatient Medications  Medication Sig Dispense Refill   albuterol (VENTOLIN HFA) 108 (90 Base) MCG/ACT inhaler Inhale into the lungs.     buPROPion (WELLBUTRIN XL) 150 MG 24 hr tablet Take 1 tablet by mouth every morning.     celecoxib (CELEBREX) 200 MG capsule Take 200 mg by mouth.     cetirizine (ZYRTEC) 10 MG tablet Take by mouth.     clonazePAM (KLONOPIN) 0.5 MG tablet Take 0.25-0.5 mg by mouth at bedtime.     cyanocobalamin (VITAMIN B12) 1000 MCG tablet Take 1,000 mcg by mouth.     diclofenac (VOLTAREN) 50 MG EC tablet Take by mouth.     ketoconazole (NIZORAL) 2 % shampoo Apply topically.     melatonin 5 MG TABS Take by mouth.     minoxidil (LONITEN) 2.5 MG tablet Take 1.25 mg by mouth daily.     nebivolol (BYSTOLIC) 2.5 MG tablet Take 2.5 mg by mouth daily.     REPATHA SURECLICK 140 MG/ML SOAJ INJECT 1 PEN UNDER THE SKIN ONCE EVERY 2 WEEKS     VITAMIN D , CHOLECALCIFEROL, PO Take by mouth. 5000 IUdaily     No current facility-administered medications for this visit.    ALLERGIES: Niacin, Benzocaine,  Epinephrine, Lanolin, Other, Paba derivatives, Statins, Nickel, Prilocaine, Procaine, and Tetracaine  Family History  Problem Relation Age of Onset   Breast cancer Mother        DCIS   Hypertension Mother    Diabetes Mother        borderline   Autoimmune disease Mother        giant cell arteritis   Thyroid disease Mother        hypothyrodism   Hypertension Father    Hypertension Sister    Thyroid disease Sister        hypothyroidism   Hypertension Brother     Review of Systems  All other systems reviewed and are negative.   PHYSICAL EXAM:  BP 126/84 (BP Location: Left Arm, Patient Position: Sitting)   Pulse 87   Ht 5' 1.5" (1.562 m)   Wt 110 lb (49.9 kg)   LMP 12/12/2014 (Approximate)   SpO2 95%   BMI 20.45 kg/m     General appearance: alert, cooperative and appears stated age Head: normocephalic, without obvious abnormality,  atraumatic Neck: no adenopathy, supple, symmetrical, trachea midline and thyroid normal to inspection and palpation Lungs: clear to auscultation bilaterally Breasts: right - normal appearance, no masses or tenderness, No nipple retraction or dimpling, No nipple discharge or bleeding, No axillary adenopathy Left - scar noted, no masses or tenderness, No nipple retraction or dimpling, No nipple discharge or bleeding, No axillary adenopathy Heart: regular rate and rhythm Abdomen: soft, non-tender; no masses, no organomegaly Extremities: extremities normal, atraumatic, no cyanosis or edema Skin: skin color, texture, turgor normal. No rashes or lesions Lymph nodes: cervical, supraclavicular, and axillary nodes normal. Neurologic: grossly normal  Pelvic: External genitalia:  no lesions              No abnormal inguinal nodes palpated.              Urethra:  normal appearing urethra with no masses, tenderness or lesions              Bartholins and Skenes: normal                 Vagina: normal appearing vagina with normal color and discharge, no lesions.  Atrophy noted.               Cervix: no lesions              Pap taken: no Bimanual Exam:  Uterus:  normal size, contour, position, consistency, mobility, non-tender              Adnexa: no mass, fullness, tenderness              Rectal exam: yes.  Confirms.              Anus:  normal sphincter tone, no lesions  Chaperone was present for exam:  Cottie Diss, CMA  ASSESSMENT: Well woman visit with gynecologic exam. Hx ASCUS pap with negative HR HPV with follow up normal.  Left breast cancer.  Status post lumpectomy and XRT. Off Tamoxifen.  Fibroids.  Atrophy.    Hx HSV I.  Hx melanoma. Osteopenia.  PHQ-9: 0. Family health stress.   PLAN: Mammogram screening discussed. Self breast awareness reviewed. Pap and HRV collected:  no.  Due in 2028.  Guidelines for Calcium, Vitamin D , regular exercise program including cardiovascular and weight  bearing exercise. Medication refills:  NA We discussed OTC vaginal vitamin E for treating atrophy.   I encouraged Hospice  support for [facility].  Follow up:  yearly and prn.

## 2024-06-18 ENCOUNTER — Other Ambulatory Visit: Payer: Self-pay | Admitting: Oncology

## 2024-06-18 DIAGNOSIS — R9230 Dense breasts, unspecified: Secondary | ICD-10-CM

## 2024-06-18 DIAGNOSIS — Z853 Personal history of malignant neoplasm of breast: Secondary | ICD-10-CM

## 2024-07-15 ENCOUNTER — Ambulatory Visit
Admission: RE | Admit: 2024-07-15 | Discharge: 2024-07-15 | Disposition: A | Discharge status: Home / Self Care | Payer: Self-pay | Source: Ambulatory Visit | Attending: Oncology | Admitting: Oncology

## 2024-07-15 DIAGNOSIS — Z853 Personal history of malignant neoplasm of breast: Secondary | ICD-10-CM

## 2024-07-15 DIAGNOSIS — R9230 Dense breasts, unspecified: Secondary | ICD-10-CM

## 2024-07-15 MED ORDER — GADOPICLENOL 0.5 MMOL/ML IV SOLN
5.0000 mL | Freq: Once | INTRAVENOUS | Status: AC | PRN
  Administered 2024-07-15: 5 mL via INTRAVENOUS

## 2025-05-19 ENCOUNTER — Ambulatory Visit: Admitting: Obstetrics and Gynecology
